# Patient Record
Sex: Female | Born: 1975 | Race: Black or African American | Hispanic: No | Marital: Single | Smoking: Never smoker
Health system: Southern US, Community
[De-identification: ages and names within clinical notes are randomized; demographics above are authoritative.]

## PROBLEM LIST (undated history)

## (undated) DIAGNOSIS — I10 Essential (primary) hypertension: Secondary | ICD-10-CM

## (undated) DIAGNOSIS — E669 Obesity, unspecified: Secondary | ICD-10-CM

---

## 2005-03-22 ENCOUNTER — Emergency Department (HOSPITAL_COMMUNITY): Admission: EM | Admit: 2005-03-22 | Discharge: 2005-03-23 | Payer: Self-pay | Admitting: Emergency Medicine

## 2005-12-12 ENCOUNTER — Ambulatory Visit (HOSPITAL_COMMUNITY): Admission: RE | Admit: 2005-12-12 | Discharge: 2005-12-12 | Payer: Self-pay | Admitting: Obstetrics

## 2006-01-25 ENCOUNTER — Inpatient Hospital Stay (HOSPITAL_COMMUNITY): Admission: AD | Admit: 2006-01-25 | Discharge: 2006-01-30 | Payer: Self-pay | Admitting: Obstetrics

## 2006-06-21 ENCOUNTER — Inpatient Hospital Stay (HOSPITAL_COMMUNITY): Admission: AD | Admit: 2006-06-21 | Discharge: 2006-06-21 | Payer: Self-pay | Admitting: Obstetrics

## 2006-06-28 ENCOUNTER — Inpatient Hospital Stay (HOSPITAL_COMMUNITY): Admission: AD | Admit: 2006-06-28 | Discharge: 2006-06-28 | Payer: Self-pay | Admitting: Obstetrics

## 2006-07-02 ENCOUNTER — Inpatient Hospital Stay (HOSPITAL_COMMUNITY): Admission: AD | Admit: 2006-07-02 | Discharge: 2006-07-05 | Payer: Self-pay | Admitting: Obstetrics

## 2007-05-20 ENCOUNTER — Emergency Department (HOSPITAL_COMMUNITY): Admission: EM | Admit: 2007-05-20 | Discharge: 2007-05-20 | Payer: Self-pay | Admitting: Emergency Medicine

## 2007-06-28 ENCOUNTER — Emergency Department (HOSPITAL_COMMUNITY): Admission: EM | Admit: 2007-06-28 | Discharge: 2007-06-28 | Payer: Self-pay | Admitting: Emergency Medicine

## 2007-11-15 ENCOUNTER — Emergency Department (HOSPITAL_COMMUNITY): Admission: EM | Admit: 2007-11-15 | Discharge: 2007-11-15 | Payer: Self-pay | Admitting: Emergency Medicine

## 2008-01-17 ENCOUNTER — Emergency Department (HOSPITAL_COMMUNITY): Admission: EM | Admit: 2008-01-17 | Discharge: 2008-01-17 | Payer: Self-pay | Admitting: Emergency Medicine

## 2008-03-14 ENCOUNTER — Inpatient Hospital Stay (HOSPITAL_COMMUNITY): Admission: AD | Admit: 2008-03-14 | Discharge: 2008-03-14 | Payer: Self-pay | Admitting: Obstetrics & Gynecology

## 2008-03-14 ENCOUNTER — Ambulatory Visit: Payer: Self-pay | Admitting: Obstetrics and Gynecology

## 2008-03-25 ENCOUNTER — Ambulatory Visit: Payer: Self-pay | Admitting: Family

## 2008-03-25 ENCOUNTER — Inpatient Hospital Stay (HOSPITAL_COMMUNITY): Admission: AD | Admit: 2008-03-25 | Discharge: 2008-03-25 | Payer: Self-pay | Admitting: Obstetrics

## 2008-04-20 ENCOUNTER — Inpatient Hospital Stay (HOSPITAL_COMMUNITY): Admission: AD | Admit: 2008-04-20 | Discharge: 2008-04-20 | Payer: Self-pay | Admitting: Obstetrics

## 2008-06-19 ENCOUNTER — Ambulatory Visit (HOSPITAL_COMMUNITY): Admission: RE | Admit: 2008-06-19 | Discharge: 2008-06-19 | Payer: Self-pay | Admitting: Obstetrics

## 2008-07-12 ENCOUNTER — Inpatient Hospital Stay (HOSPITAL_COMMUNITY): Admission: AD | Admit: 2008-07-12 | Discharge: 2008-07-12 | Payer: Self-pay | Admitting: Obstetrics

## 2008-09-23 ENCOUNTER — Ambulatory Visit (HOSPITAL_COMMUNITY): Admission: RE | Admit: 2008-09-23 | Discharge: 2008-09-23 | Payer: Self-pay | Admitting: Obstetrics

## 2008-10-15 ENCOUNTER — Inpatient Hospital Stay (HOSPITAL_COMMUNITY): Admission: AD | Admit: 2008-10-15 | Discharge: 2008-10-15 | Payer: Self-pay | Admitting: Obstetrics

## 2008-11-07 ENCOUNTER — Inpatient Hospital Stay (HOSPITAL_COMMUNITY): Admission: AD | Admit: 2008-11-07 | Discharge: 2008-11-09 | Payer: Self-pay | Admitting: Obstetrics

## 2008-11-08 ENCOUNTER — Encounter (INDEPENDENT_AMBULATORY_CARE_PROVIDER_SITE_OTHER): Payer: Self-pay | Admitting: Obstetrics

## 2010-01-23 ENCOUNTER — Encounter: Payer: Self-pay | Admitting: Obstetrics

## 2010-04-06 LAB — CBC
MCV: 80 fL (ref 78.0–100.0)
MCV: 80.3 fL (ref 78.0–100.0)
Platelets: 268 10*3/uL (ref 150–400)
RBC: 3.53 MIL/uL — ABNORMAL LOW (ref 3.87–5.11)
RBC: 3.63 MIL/uL — ABNORMAL LOW (ref 3.87–5.11)
RDW: 15.7 % — ABNORMAL HIGH (ref 11.5–15.5)
WBC: 11.4 10*3/uL — ABNORMAL HIGH (ref 4.0–10.5)

## 2010-04-06 LAB — RPR: RPR Ser Ql: NONREACTIVE

## 2010-04-06 LAB — COMPREHENSIVE METABOLIC PANEL
Albumin: 3 g/dL — ABNORMAL LOW (ref 3.5–5.2)
BUN: 1 mg/dL — ABNORMAL LOW (ref 6–23)
CO2: 22 mEq/L (ref 19–32)
Chloride: 107 mEq/L (ref 96–112)
GFR calc Af Amer: >60 mL/min (ref >60)
Glucose, Bld: 90 mg/dL (ref 70–99)
Potassium: 3.8 mEq/L (ref 3.5–5.1)
Total Bilirubin: 0.5 mg/dL (ref 0.3–1.2)

## 2010-04-06 LAB — URIC ACID: Uric Acid, Serum: 5 mg/dL (ref 2.4–7.0)

## 2010-04-10 LAB — URINE MICROSCOPIC-ADD ON

## 2010-04-10 LAB — URINALYSIS, ROUTINE W REFLEX MICROSCOPIC
Bilirubin Urine: NEGATIVE
Glucose, UA: NEGATIVE mg/dL
Hgb urine dipstick: NEGATIVE
Ketones, ur: NEGATIVE mg/dL
Protein, ur: NEGATIVE mg/dL
Specific Gravity, Urine: 1.015 (ref 1.005–1.030)
pH: 6 (ref 5.0–8.0)

## 2010-04-10 LAB — URINE CULTURE

## 2010-04-10 LAB — WET PREP, GENITAL
Clue Cells Wet Prep HPF POC: NONE SEEN
Trich, Wet Prep: NONE SEEN

## 2010-04-10 LAB — GC/CHLAMYDIA PROBE AMP, GENITAL: GC Probe Amp, Genital: NEGATIVE

## 2010-04-13 LAB — URINALYSIS, ROUTINE W REFLEX MICROSCOPIC
Bilirubin Urine: NEGATIVE
Hgb urine dipstick: NEGATIVE
Ketones, ur: 15 mg/dL — AB
Nitrite: NEGATIVE
Protein, ur: NEGATIVE mg/dL
Urobilinogen, UA: 0.2 mg/dL (ref 0.0–1.0)

## 2010-04-13 LAB — WET PREP, GENITAL: Yeast Wet Prep HPF POC: NONE SEEN

## 2010-04-13 LAB — GC/CHLAMYDIA PROBE AMP, GENITAL: Chlamydia, DNA Probe: NEGATIVE

## 2010-04-14 LAB — URINE MICROSCOPIC-ADD ON

## 2010-04-14 LAB — GC/CHLAMYDIA PROBE AMP, GENITAL: GC Probe Amp, Genital: NEGATIVE

## 2010-04-14 LAB — URINALYSIS, ROUTINE W REFLEX MICROSCOPIC
Bilirubin Urine: NEGATIVE
Glucose, UA: NEGATIVE mg/dL
Hgb urine dipstick: NEGATIVE
Ketones, ur: NEGATIVE mg/dL
Nitrite: POSITIVE — AB
Protein, ur: NEGATIVE mg/dL
Specific Gravity, Urine: 1.015 (ref 1.005–1.030)
Specific Gravity, Urine: 1.01 (ref 1.005–1.030)
Urobilinogen, UA: 0.2 mg/dL (ref 0.0–1.0)
pH: 6.5 (ref 5.0–8.0)

## 2010-04-14 LAB — URINE CULTURE

## 2010-04-14 LAB — CBC
HCT: 37.3 % (ref 36.0–46.0)
Hemoglobin: 12.1 g/dL (ref 12.0–15.0)
RBC: 4.39 MIL/uL (ref 3.87–5.11)
RDW: 16.5 % — ABNORMAL HIGH (ref 11.5–15.5)
WBC: 8.4 10*3/uL (ref 4.0–10.5)

## 2010-04-14 LAB — ABO/RH: ABO/RH(D): O POS

## 2010-04-14 LAB — WET PREP, GENITAL
Trich, Wet Prep: NONE SEEN
Yeast Wet Prep HPF POC: NONE SEEN

## 2010-04-18 LAB — POCT I-STAT, CHEM 8
BUN: 4 mg/dL — ABNORMAL LOW (ref 6–23)
Hemoglobin: 13.3 g/dL (ref 12.0–15.0)
Potassium: 3.7 mEq/L (ref 3.5–5.1)
Sodium: 140 mEq/L (ref 135–145)
TCO2: 24 mmol/L (ref 0–100)

## 2010-04-18 LAB — URINALYSIS, ROUTINE W REFLEX MICROSCOPIC
Bilirubin Urine: NEGATIVE
Hgb urine dipstick: NEGATIVE
Ketones, ur: NEGATIVE mg/dL
Specific Gravity, Urine: 1.02 (ref 1.005–1.030)
Urobilinogen, UA: 0.2 mg/dL (ref 0.0–1.0)

## 2010-04-18 LAB — URINE MICROSCOPIC-ADD ON

## 2010-04-18 LAB — PREGNANCY, URINE: Preg Test, Ur: NEGATIVE

## 2010-05-20 NOTE — Discharge Summary (Signed)
NAMEIMBERLY, TROXLER                 ACCOUNT NO.:  [id number]   MEDICAL RECORD NO.:  [id number]          PATIENT TYPE:  INP   LOCATION:  9320                          FACILITY:  [facility]   PHYSICIAN:  Kathreen Cosier, M.D.DATE OF BIRTH:  1975-09-28   DATE OF ADMISSION:  01/25/2006  DATE OF DISCHARGE:  01/30/2006                               DISCHARGE SUMMARY   The patient is a 35 year old gravida 6, para 4-0-1-3 who was admitted  with back pain on the right radiating to her flank. She is [redacted] weeks  pregnant. She had a positive urine, 21 to 50 white, many bacteria. She  was admitted for pyelonephritis and started on Rocephin. The patient had  temperature elevation once daily, and by January 29, she was afebrile  for greater than 24 hours. She was discharged home on ampicillin 500  p.o. q.6h. to see me in 2 weeks.   DISCHARGE DIAGNOSES:  1. Status post intrauterine pregnancy, 17 weeks.  2. Pyelonephritis.           ______________________________  Kathreen Cosier, M.D.     BAM/MEDQ  D:  02/14/2006  T:  02/14/2006  Job:  [id number]

## 2010-05-20 NOTE — Discharge Summary (Signed)
Molly Green, Molly Green                 ACCOUNT NO.:  [id number]   MEDICAL RECORD NO.:  [id number]          PATIENT TYPE:  INP   LOCATION:  9123                          FACILITY:  [facility]   PHYSICIAN:  Kathreen Cosier, M.D.DATE OF BIRTH:  [birth year]   DATE OF ADMISSION:  07/02/2006  DATE OF DISCHARGE:  07/05/2006                               DISCHARGE SUMMARY   The patient is a 35 year old gravida 6, para 4-0-1-2; [facility] July 07, 2006.  She was admitted with premature contractions and she had decreased  variability.  She had a positive GBS and had a C-section in 1996 and  then had three subsequent vaginal deliveries.  On admission her cervix  was 3 cm, 80%, vertex -2 to -3.  Membranes ruptured artificially, no  fluid obtained, and IUPC was inserted and started on low-dose Pitocin.  She had normal vaginal delivery from the LOA position of a female,  Apgars 8 and 9.  There was a nuchal cord x1 reduced prior to delivery.  Placenta was spontaneous and intact.  She was kept on magnesium sulfate  and on postpartum day #1 blood pressure was 12/84 and magnesium was  discontinued.  She was on labetalol 200 mg p.o. b.i.d., and by the  second postpartum day blood pressure was 124/70.  She was discharged on  labetalol 200 p.o. b.i.d. and Tylenol No. 3 for pain.  On admission,  hemoglobin 9.6, postdelivery 9.6.  White count 11.8 and 14.3.  Sodium  139, potassium 3.6, chloride 112, creatinine 0.52.  She was discharged  home to see me in 1 week for blood pressure check.   DISCHARGE DIAGNOSES:  1. Status post normal vaginal delivery at term.  2. Elevated blood pressures.           ______________________________  Kathreen Cosier, M.D.     BAM/MEDQ  D:  08/08/2006  T:  08/08/2006  Job:  [id number]

## 2010-09-28 LAB — DIFFERENTIAL
Lymphs Abs: 3.1
Monocytes Relative: 5
Neutro Abs: 6.6
Neutrophils Relative %: 63

## 2010-09-28 LAB — URINALYSIS, ROUTINE W REFLEX MICROSCOPIC
Glucose, UA: NEGATIVE
Specific Gravity, Urine: 1.016
pH: 6.5

## 2010-09-28 LAB — POCT I-STAT, CHEM 8
Calcium, Ion: 1.19
Chloride: 103
Glucose, Bld: 88
HCT: 40

## 2010-09-28 LAB — CBC
RBC: 4.42
WBC: 10.5

## 2010-09-28 LAB — URINE MICROSCOPIC-ADD ON

## 2010-09-28 LAB — POCT PREGNANCY, URINE: Operator id: 151321

## 2010-09-29 LAB — URINE MICROSCOPIC-ADD ON

## 2010-09-29 LAB — URINALYSIS, ROUTINE W REFLEX MICROSCOPIC
Bilirubin Urine: NEGATIVE
Ketones, ur: NEGATIVE
Nitrite: POSITIVE — AB
pH: 6.5

## 2010-09-29 LAB — URINE CULTURE: Colony Count: >=100000

## 2010-09-29 LAB — POCT PREGNANCY, URINE
Operator id: 29502
Preg Test, Ur: NEGATIVE

## 2010-10-04 LAB — URINE CULTURE

## 2010-10-04 LAB — URINALYSIS, ROUTINE W REFLEX MICROSCOPIC
Bilirubin Urine: NEGATIVE
Glucose, UA: NEGATIVE
Hgb urine dipstick: NEGATIVE
Ketones, ur: NEGATIVE
Protein, ur: NEGATIVE
pH: 6.5

## 2010-10-04 LAB — URINE MICROSCOPIC-ADD ON

## 2010-10-18 LAB — COMPREHENSIVE METABOLIC PANEL
ALT: 9
Alkaline Phosphatase: 121 — ABNORMAL HIGH
BUN: 1 — ABNORMAL LOW
CO2: 26
GFR calc non Af Amer: >60
Glucose, Bld: 70
Potassium: 3.2 — ABNORMAL LOW
Sodium: 140
Total Protein: 5.2 — ABNORMAL LOW

## 2010-10-18 LAB — LACTATE DEHYDROGENASE: LDH: 179

## 2010-10-18 LAB — CBC
HCT: 28.9 — ABNORMAL LOW
Hemoglobin: 9.6 — ABNORMAL LOW
MCHC: 33
RBC: 3.52 — ABNORMAL LOW
RDW: 14.6 — ABNORMAL HIGH

## 2010-10-18 LAB — MAGNESIUM: Magnesium: 4.5 — ABNORMAL HIGH

## 2010-10-19 LAB — RPR: RPR Ser Ql: NONREACTIVE

## 2010-10-19 LAB — COMPREHENSIVE METABOLIC PANEL
AST: 18
Albumin: 2.9 — ABNORMAL LOW
BUN: 3 — ABNORMAL LOW
Calcium: 8.6
Chloride: 112
Creatinine, Ser: 0.52
GFR calc Af Amer: >60
Total Bilirubin: 0.7
Total Protein: 5.6 — ABNORMAL LOW

## 2010-10-19 LAB — CBC
MCHC: 32.9
MCV: 82.5
Platelets: 260
RBC: 3.55 — ABNORMAL LOW
RDW: 14.2 — ABNORMAL HIGH

## 2010-10-19 LAB — URIC ACID: Uric Acid, Serum: 5.2

## 2011-11-17 ENCOUNTER — Emergency Department (HOSPITAL_COMMUNITY)
Admission: EM | Admit: 2011-11-17 | Discharge: 2011-11-17 | Disposition: A | Discharge status: Home / Self Care | Payer: Self-pay | Attending: Emergency Medicine | Admitting: Emergency Medicine

## 2011-11-17 DIAGNOSIS — S61219A Laceration without foreign body of unspecified finger without damage to nail, initial encounter: Secondary | ICD-10-CM

## 2011-11-17 DIAGNOSIS — W260XXA Contact with knife, initial encounter: Secondary | ICD-10-CM | POA: Insufficient documentation

## 2011-11-17 DIAGNOSIS — Y929 Unspecified place or not applicable: Secondary | ICD-10-CM | POA: Insufficient documentation

## 2011-11-17 DIAGNOSIS — Y93H2 Activity, gardening and landscaping: Secondary | ICD-10-CM | POA: Insufficient documentation

## 2011-11-17 DIAGNOSIS — S61209A Unspecified open wound of unspecified finger without damage to nail, initial encounter: Secondary | ICD-10-CM | POA: Insufficient documentation

## 2011-11-17 NOTE — ED Notes (Signed)
Pt accidentally jabbed L index finger with a knife while cutting some roses. No bleeding at present.

## 2011-11-17 NOTE — ED Provider Notes (Signed)
History     CSN: [id number]  Arrival date & time 11/17/11  1707   First MD Initiated Contact with Patient 11/17/11 1721      Chief Complaint  Patient presents with  . Finger Laceration     (Consider location/radiation/quality/duration/timing/severity/associated sxs/prior treatment) Patient is a 36 y.o. female presenting with skin laceration. The history is provided by the patient.  Laceration  The incident occurred 1 to 2 hours ago. The laceration is located on the left hand. The laceration is 1 cm in size. The laceration mechanism was a a metal edge. She reports no foreign bodies present. Her tetanus status is UTD.    No past medical history on file.  No past surgical history on file.  No family history on file.  History  Substance Use Topics  . Smoking status: Not on file  . Smokeless tobacco: Not on file  . Alcohol Use: Not on file    OB History    No data available      Review of Systems  Constitutional: Negative.   Musculoskeletal: Negative.   Skin:       C/O laceration.  Neurological: Negative.     Allergies  Review of patient's allergies indicates no known allergies.  Home Medications  No current outpatient prescriptions on file.  BP 170/115  Pulse 79  Temp 98.3 F (36.8 C) (Oral)  Resp 17  SpO2 100%  Physical Exam  Constitutional: She appears well-developed and well-nourished.  Pulmonary/Chest: Effort normal.  Skin: Skin is warm and dry.       1 cm laceration perpendicular to nail edge left index finger.. Closed, no active bleeding. No swelling to finger. FROM.    ED Course  Procedures (including critical care time)  Labs Reviewed - No data to display No results found.   No diagnosis found.  1. Laceration, nonsuturable, left index finger  MDM  Uncomplicated finger laceration. Cleaned and steristrip applied.        Rodena Medin, PA-C 11/17/11 [phone]

## 2011-11-18 NOTE — ED Provider Notes (Signed)
Medical screening examination/treatment/procedure(s) were performed by non-physician practitioner and as supervising physician I was immediately available for consultation/collaboration.  Gerhard Munch, MD 11/18/11 0000

## 2014-08-09 ENCOUNTER — Emergency Department (HOSPITAL_COMMUNITY)
Admission: EM | Admit: 2014-08-09 | Discharge: 2014-08-09 | Disposition: A | Discharge status: Home / Self Care | Payer: Medicaid Other | Attending: Emergency Medicine | Admitting: Emergency Medicine

## 2014-08-09 ENCOUNTER — Encounter (HOSPITAL_COMMUNITY): Payer: Self-pay | Admitting: *Deleted

## 2014-08-09 DIAGNOSIS — W19XXXA Unspecified fall, initial encounter: Secondary | ICD-10-CM

## 2014-08-09 DIAGNOSIS — Y998 Other external cause status: Secondary | ICD-10-CM | POA: Diagnosis not present

## 2014-08-09 DIAGNOSIS — Y9289 Other specified places as the place of occurrence of the external cause: Secondary | ICD-10-CM | POA: Diagnosis not present

## 2014-08-09 DIAGNOSIS — I10 Essential (primary) hypertension: Secondary | ICD-10-CM | POA: Insufficient documentation

## 2014-08-09 DIAGNOSIS — S79911A Unspecified injury of right hip, initial encounter: Secondary | ICD-10-CM | POA: Diagnosis present

## 2014-08-09 DIAGNOSIS — Y9389 Activity, other specified: Secondary | ICD-10-CM | POA: Diagnosis not present

## 2014-08-09 DIAGNOSIS — S3992XA Unspecified injury of lower back, initial encounter: Secondary | ICD-10-CM | POA: Insufficient documentation

## 2014-08-09 DIAGNOSIS — E669 Obesity, unspecified: Secondary | ICD-10-CM | POA: Insufficient documentation

## 2014-08-09 DIAGNOSIS — M79604 Pain in right leg: Secondary | ICD-10-CM

## 2014-08-09 DIAGNOSIS — W010XXA Fall on same level from slipping, tripping and stumbling without subsequent striking against object, initial encounter: Secondary | ICD-10-CM | POA: Diagnosis not present

## 2014-08-09 HISTORY — DX: Essential (primary) hypertension: I10

## 2014-08-09 HISTORY — DX: Obesity, unspecified: E66.9

## 2014-08-09 MED ORDER — METHOCARBAMOL 500 MG PO TABS: 500.0000 mg | ORAL_TABLET | Freq: Two times a day (BID) | ORAL | Status: DC

## 2014-08-09 MED ORDER — NAPROXEN 500 MG PO TABS: 500.0000 mg | ORAL_TABLET | Freq: Two times a day (BID) | ORAL | Status: DC

## 2014-08-09 MED ORDER — METHOCARBAMOL 500 MG PO TABS
500.0000 mg | ORAL_TABLET | Freq: Once | ORAL | Status: AC
Start: 2014-08-09 — End: 2014-08-09
  Administered 2014-08-09: 500 mg via ORAL
  Filled 2014-08-09: qty 1

## 2014-08-09 MED ORDER — OXYCODONE-ACETAMINOPHEN 5-325 MG PO TABS
1.0000 | ORAL_TABLET | Freq: Once | ORAL | Status: AC
  Administered 2014-08-09: 1 via ORAL

## 2014-08-09 MED ORDER — OXYCODONE-ACETAMINOPHEN 5-325 MG PO TABS
ORAL_TABLET | ORAL | Status: AC
  Filled 2014-08-09: qty 1

## 2014-08-09 NOTE — ED Provider Notes (Signed)
CSN: [id number]     Arrival date & time 08/09/14  1710 History  This chart was scribed for non-physician practitioner, Catha Gosselin, PA-C, working with Pricilla Loveless, MD, by Ronney Lion, ED Scribe. This patient was seen in room TR06C/TR06C and the patient's care was started at 6:59 PM.    Chief Complaint  Patient presents with  . Fall   The history is provided by the patient and the spouse. No language interpreter was used.   HPI Comments: Molly Green is a 39 y.o. female who presents to the Emergency Department complaining of right leg pain S/P falling after slipping on a puddle at [employer] about 7 hours ago. Her husband states she landed on the ground and landed in a way resembling a split. He and a sales person lifted her up immediately after she fell. She denies head injury or LOC. Patient is able to ambulate without difficulty. She denies numbness.   Past Medical History  Diagnosis Date  . Obesity   . Hypertension    History reviewed. No pertinent past surgical history. History reviewed. No pertinent family history. History  Substance Use Topics  . Smoking status: Not on file  . Smokeless tobacco: Not on file  . Alcohol Use: No   OB History    No data available     Review of Systems  Musculoskeletal: Positive for back pain and arthralgias (right hip pain).  Neurological: Negative for numbness.   Allergies  Review of patient's allergies indicates no known allergies.  Home Medications   Prior to Admission medications   Medication Sig Start Date End Date Taking? Authorizing Provider  methocarbamol (ROBAXIN) 500 MG tablet Take 1 tablet (500 mg total) by mouth 2 (two) times daily. 08/09/14   Jarad Barth Patel-Mills, PA-C  naproxen (NAPROSYN) 500 MG tablet Take 1 tablet (500 mg total) by mouth 2 (two) times daily. 08/09/14   Laure Leone Patel-Mills, PA-C   BP 159/97 mmHg  Pulse 72  Temp(Src) 99 F (37.2 C) (Oral)  Resp 16  SpO2 100%  LMP 08/06/2014 Physical Exam  Constitutional:  She is oriented to person, place, and time. She appears well-developed and well-nourished. No distress.  HENT:  Head: Normocephalic and atraumatic.  Eyes: Conjunctivae and EOM are normal.  Neck: Neck supple. No tracheal deviation present.  Cardiovascular: Normal rate, regular rhythm and normal heart sounds.   Pulses:      Dorsalis pedis pulses are 2+ on the right side, and 2+ on the left side.       Posterior tibial pulses are 2+ on the right side, and 2+ on the left side.  Pulmonary/Chest: Effort normal. No respiratory distress.  Musculoskeletal: Normal range of motion.  Able to flex and extend right knee. DP and PT pulses 2+. No saddle anesthesia. No midline lumbar tenderness. Hips are stable.   Neurological: She is alert and oriented to person, place, and time. Gait normal.  Ambulatory with steady gait. No leg shortening.  Skin: Skin is warm and dry.  Psychiatric: She has a normal mood and affect. Her behavior is normal.  Nursing note and vitals reviewed.   ED Course  Procedures (including critical care time)  DIAGNOSTIC STUDIES: Oxygen Saturation is 100% on RA, normal by my interpretation.    COORDINATION OF CARE: 7:00 PM - Doubt fracture. Suspect muscle injury. Discussed treatment plan with pt at bedside which includes Rx muscle relaxants and anti-inflammatories. As pt's husband is driving, will administer one dose of muscle relaxants here. Pt verbalized understanding  and agreed to plan.  MDM   Final diagnoses:  Fall, initial encounter  Right leg pain  Patient has no signs for cauda equina syndrome. I do not suspect hip or pelvis fracture. I personally performed the services described in this documentation, which was scribed in my presence. The recorded information has been reviewed and is accurate.     Catha Gosselin, PA-C 08/09/14 2314  Pricilla Loveless, MD 08/10/14 [phone]

## 2014-08-09 NOTE — Discharge Instructions (Signed)

## 2014-08-09 NOTE — ED Notes (Signed)
Ice pack given

## 2014-08-09 NOTE — ED Notes (Signed)
Pt reports falling at [employer] and now has lower back and right hip pain.

## 2015-11-03 ENCOUNTER — Encounter (HOSPITAL_COMMUNITY): Payer: Self-pay | Admitting: *Deleted

## 2015-11-03 ENCOUNTER — Emergency Department (HOSPITAL_COMMUNITY)
Admission: EM | Admit: 2015-11-03 | Discharge: 2015-11-03 | Disposition: A | Discharge status: Home / Self Care | Payer: Worker's Compensation | Attending: Emergency Medicine | Admitting: Emergency Medicine

## 2015-11-03 ENCOUNTER — Emergency Department (HOSPITAL_COMMUNITY): Payer: Worker's Compensation

## 2015-11-03 DIAGNOSIS — I10 Essential (primary) hypertension: Secondary | ICD-10-CM | POA: Insufficient documentation

## 2015-11-03 DIAGNOSIS — X101XXA Contact with hot food, initial encounter: Secondary | ICD-10-CM | POA: Diagnosis not present

## 2015-11-03 DIAGNOSIS — T25121A Burn of first degree of right foot, initial encounter: Secondary | ICD-10-CM | POA: Diagnosis not present

## 2015-11-03 DIAGNOSIS — T300 Burn of unspecified body region, unspecified degree: Secondary | ICD-10-CM

## 2015-11-03 DIAGNOSIS — Y929 Unspecified place or not applicable: Secondary | ICD-10-CM | POA: Insufficient documentation

## 2015-11-03 DIAGNOSIS — Y93G3 Activity, cooking and baking: Secondary | ICD-10-CM | POA: Insufficient documentation

## 2015-11-03 DIAGNOSIS — Y990 Civilian activity done for income or pay: Secondary | ICD-10-CM | POA: Diagnosis not present

## 2015-11-03 DIAGNOSIS — S9031XA Contusion of right foot, initial encounter: Secondary | ICD-10-CM

## 2015-11-03 DIAGNOSIS — S99921A Unspecified injury of right foot, initial encounter: Secondary | ICD-10-CM | POA: Diagnosis present

## 2015-11-03 MED ORDER — IBUPROFEN 600 MG PO TABS: 600.0000 mg | ORAL_TABLET | Freq: Four times a day (QID) | ORAL | 0 refills | Status: DC | PRN

## 2015-11-03 MED ORDER — IBUPROFEN 800 MG PO TABS
800.0000 mg | ORAL_TABLET | Freq: Once | ORAL | Status: AC
  Administered 2015-11-03: 800 mg via ORAL
  Filled 2015-11-03: qty 1

## 2015-11-03 MED ORDER — SILVER SULFADIAZINE 1 % EX CREA: 1.0000 "application " | TOPICAL_CREAM | Freq: Every day | CUTANEOUS | 0 refills | Status: DC

## 2015-11-03 MED ORDER — TRAMADOL HCL 50 MG PO TABS: 50.0000 mg | ORAL_TABLET | Freq: Four times a day (QID) | ORAL | 0 refills | Status: DC | PRN

## 2015-11-03 NOTE — ED Provider Notes (Signed)
[facility]-EMERGENCY DEPT Provider Note   CSN: [id number] Arrival date & time: 11/03/15  0840     History   Chief Complaint Chief Complaint  Patient presents with  . Burn  . Cough    HPI Molly Green is a 40 y.o. female.  Patient is a 40 year old female with past medical history of hypertension who presents the ED with complaint of burn. 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 She reports having a small burn to the top of her right foot. Endorses associated pain to her foot which she states is worse when bearing weight and reports having a blister that has formed on the top of her foot. She reports using over-the-counter burn cream and placing ice on her foot with very mild relief. Denies taking any medications. Denies fever, swelling, drainage, numbness, tingling, weakness.  She also reports she has had a nonproductive cough for the past month. She notes she initially started with cold-like symptoms a few weeks ago including nasal congestion, rhinorrhea and sore throat but notes the symptoms have since resolved. She states however she has continued to have a nonproductive cough over the past week. Denies taking any medications for her symptoms. Denies fever, chills, headache, nasal congestion, sore throat, hemoptysis, wheezing, shortness of breath, chest pain, vomiting, abdominal pain. Denies any known sick contacts.      Past Medical History:  Diagnosis Date  . Hypertension   . Obesity     There are no active problems to display for this patient.   History reviewed. No pertinent surgical history.  OB History    No data available       Home Medications    Prior to Admission medications   Medication Sig Start Date End Date Taking?  Authorizing Provider  ibuprofen (ADVIL,MOTRIN) 600 MG tablet Take 1 tablet (600 mg total) by mouth every 6 (six) hours as needed. 11/03/15   Barrett Henle, PA-C  methocarbamol (ROBAXIN) 500 MG tablet Take 1 tablet (500 mg total) by mouth 2 (two) times daily. Patient not taking: Reported on 11/03/2015 08/09/14   Catha Gosselin, PA-C  naproxen (NAPROSYN) 500 MG tablet Take 1 tablet (500 mg total) by mouth 2 (two) times daily. Patient not taking: Reported on 11/03/2015 08/09/14   Catha Gosselin, PA-C  silver sulfADIAZINE (SILVADENE) 1 % cream Apply 1 application topically daily. 11/03/15   Barrett Henle, PA-C  traMADol (ULTRAM) 50 MG tablet Take 1 tablet (50 mg total) by mouth every 6 (six) hours as needed. 11/03/15   Barrett Henle, PA-C    Family History No family history on file.  Social History Social History  Substance Use Topics  . Smoking status: Never Smoker  . Smokeless tobacco: Never Used  . Alcohol use No     Allergies   Review of patient's allergies indicates no known allergies.   Review of Systems Review of Systems  Respiratory: Positive for cough.   Musculoskeletal: Positive for arthralgias (right foot).  Skin: Positive for wound (burn).  All other systems reviewed and are negative.    Physical Exam Updated Vital Signs BP 162/97   Pulse 74   Temp 98.1 F (36.7 C) (Oral)   Resp 16   Ht  5\' 4"  (1.626 m)   Wt 97.5 kg   LMP 10/20/2015 (Approximate)   SpO2 100%   BMI 36.90 kg/m   Physical Exam  Constitutional: She is oriented to person, place, and time. She appears well-developed and well-nourished.  HENT:  Head: Normocephalic and atraumatic.  Nose: Nose normal.  Mouth/Throat: Uvula is midline, oropharynx is clear and moist and mucous membranes are normal. No oropharyngeal exudate, posterior oropharyngeal edema, posterior oropharyngeal erythema or tonsillar abscesses. No tonsillar exudate.  Eyes: Conjunctivae and EOM are normal.  Right eye exhibits no discharge. Left eye exhibits no discharge. No scleral icterus.  Neck: Normal range of motion. Neck supple.  Cardiovascular: Normal rate, regular rhythm, normal heart sounds and intact distal pulses.   Pulmonary/Chest: Effort normal and breath sounds normal. No respiratory distress. She has no wheezes. She has no rales. She exhibits no tenderness.  Abdominal: Soft. She exhibits no distension.  Musculoskeletal: Normal range of motion. She exhibits tenderness. She exhibits no edema or deformity.       Right ankle: She exhibits normal range of motion, no swelling, no ecchymosis, no deformity, no laceration and normal pulse. No tenderness. Achilles tendon normal.       Right foot: There is tenderness. There is normal range of motion, no swelling, normal capillary refill, no crepitus, no deformity and no laceration.       Feet:  Lymphadenopathy:    She has no cervical adenopathy.  Neurological: She is alert and oriented to person, place, and time.  Skin: Skin is warm and dry.  Erythema noted to dorsal aspect of right midfoot with small blister present, TTP. No drainage.   Nursing note and vitals reviewed.    ED Treatments / Results  Labs (all labs ordered are listed, but only abnormal results are displayed) Labs Reviewed - No data to display  EKG  EKG Interpretation None       Radiology Ct Foot Right Wo Contrast  Result Date: 11/03/2015 CLINICAL DATA:  Right foot pain.  Can't bear weight. EXAM: CT OF THE RIGHT FOOT WITHOUT CONTRAST TECHNIQUE: Multidetector CT imaging of the right foot was performed according to the standard protocol. Multiplanar CT image reconstructions were also generated. COMPARISON:  None. FINDINGS: Bones/Joint/Cartilage No acute fracture or dislocation. Moderate osteoarthritis of the first MTP joint. Prior hallux valgus repair. Mild osteoarthritis of the first tarsal metatarsal joint. 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  Enthesopathic changes of the Achilles tendon insertion. Normal subtalar joints. Normal ankle mortise. Mild osteoarthritis of the talonavicular joint. Degenerative changes of the medial hallux sesamoid-metatarsal articulation. No aggressive lytic or sclerotic osseous lesion. Ligaments Suboptimally assessed by CT. Muscles and Tendons Muscles are normal. Flexor, extensor, peroneal and Achilles tendons are grossly intact. Soft tissues No fluid collection or hematoma. IMPRESSION: 1.  No acute osseous injury of the right foot. 2. Prior hallux valgus repair. Moderate osteoarthritis of the first MTP joint. Mild osteoarthritis of the first tarsal metatarsal joint. 3. Osseous coalition of the middle-medial cuneiform. Electronically Signed   By: Elige Ko   On: 11/03/2015 11:05   Dg Foot Complete Right  Result Date: 11/03/2015 CLINICAL DATA:  Patient dropped a can of hot belted cheese on the right foot at work last night with blistering over the foot and pain over the dorsum of the foot. EXAM: RIGHT FOOT COMPLETE - 3+ VIEW COMPARISON:  None in PACs FINDINGS: The bones are subjectively adequately mineralized. The phalanges are intact. There are degenerative changes of the first MTP  joint. There is a mild hallux valgus contour of the first ray which may have been previously surgically treated. The second through fifth MTP joints are unremarkable. The IP joints exhibit no acute abnormalities. There is a double density that projects in the region of the first and second cuneiforms which may be degenerative change or could reflect a fracture. The third through fifth metatarsal bases appear normal. There is an Achilles region calcaneal spur. IMPRESSION: 1. Abnormal appearance of the first and second cuneiforms which may reflect acute fracture or dislocation. Degenerative or old post traumatic changes could produce similar findings. 2. Moderate degenerative change of the first MTP joint. Possible previous hallux valgus repair  versus old trauma involving the distal aspect of the first metatarsal. 3. If the object dropped upon the foot was heavy and a fracture felt clinically possible, CT scanning of the right foot with attention to the first tarsometatarsal joint and the first metatarsophalangeal joint would be useful. Electronically Signed   By: David  SwazilandJordan M.D.   On: 11/03/2015 09:37    Procedures Procedures (including critical care time)  Medications Ordered in ED Medications  ibuprofen (ADVIL,MOTRIN) tablet 800 mg (800 mg Oral Given 11/03/15 0930)     Initial Impression / Assessment and Plan / ED Course  I have reviewed the triage vital signs and the nursing notes.  Pertinent labs & imaging results that were available during my care of the patient were reviewed by me and considered in my medical decision making (see chart for details).  Clinical Course    Patient presents with burn to her right foot that occurred yesterday while at work. Reports dropping a can of hot cheese on her foot, endorses associated pain and reports having a blister appear today. Denies fever, drainage. History of bunion surgery to right foot. VSS. Exam revealed small superficial partial-thickness burn to dorsum of right foot with blister present, no drainage, no surrounding swelling, erythema or warmth. Tenderness over dorsum of right foot. Right lower extremity neurovascular intact. Right foot x-ray revealed abnormal appearance of first and second cuneiforms which may reflect acute fracture or dislocation, recommend CT scan for further evaluation. CT revealed no acute injury. Dressing and esterase and ointment applied to burn in the ED. Plan to discharge patient home with wound care for burn and symptomatically treatment. Discussed return precautions.  Patient also reports having nonproductive cough for the past month, reports initially having cold-like symptoms a few weeks ago which have since resolved. Denies fever. Exam unremarkable,  lungs clear to auscultation bilaterally. Do not feel that CXR is warranted at this time, suspect possible postnasal drip cough. Pt discharge patient with symptomatic treatment and advised to take Robitussin as needed. Advised to follow up with PCP for further evaluation. Discussed return precautions.  Final Clinical Impressions(s) / ED Diagnoses   Final diagnoses:  Burn  Contusion of right foot, initial encounter    New Prescriptions Discharge Medication List as of 11/03/2015 11:36 AM    START taking these medications   Details  ibuprofen (ADVIL,MOTRIN) 600 MG tablet Take 1 tablet (600 mg total) by mouth every 6 (six) hours as needed., Starting Wed 11/03/2015, Print    silver sulfADIAZINE (SILVADENE) 1 % cream Apply 1 application topically daily., Starting Wed 11/03/2015, Print    traMADol (ULTRAM) 50 MG tablet Take 1 tablet (50 mg total) by mouth every 6 (six) hours as needed., Starting Wed 11/03/2015, Print         Satira Sarkicole Elizabeth StrausstownNadeau, [place] JerseyPA-C 11/03/15 1524  Canary Brimhristopher J Tegeler, MD 11/03/15 [phone]

## 2015-11-03 NOTE — ED Triage Notes (Addendum)
Patient presents from home with c/o intermittently productive cough x3-4 weeks following an URI several weeks ago. Patient denies smoking or hx of allergies or respiratory illness.  Patient denies N/V/D and fever.  Patient's lung sounds CTAB with no increased WOB.  Heart sounds normal, S1/S2, +2 radial pulses bilaterally.  Patient also c/o burn to top of right foot after some hot melted cheese sauce splashed on her foot at work last night. Skin is intact, but some blistering noted.  No deformity or swelling noted.  Patient c/o pain in that foot and states in addition to the hot cheese, the pan the cheese was in also fell on her foot.  +1 dorsalis pedis pulse palpated.  Lower extremity strength equal bilaterally, light touch sensation intact.

## 2015-11-03 NOTE — Progress Notes (Signed)
[facility] ([facility] listed per [employer] [place] access response hx in EPIC registration section  Cm spoke with pt who has her medicaid card for 2014-2016 but not 2017 Pt confirms she has not changed addresses and has stop going to [facility] and go to another md near [facility] Can not recall correct name  Cm discussed with her the what was listed in EPIC as her pcp [facility] and encouraged her to contact DSS for any corrections related to her medicaid MD  ED RN came in to d/c pt

## 2015-11-03 NOTE — Discharge Instructions (Signed)
Keep wound clean using antibacterial soap and water, gently pat dry. You may apply a small amount of bacitracin ointment to wound daily. Take your medications as prescribed as needed for pain relief. I also recommend applying ice to affected area for 15 minutes 3-4 times daily for additional relief. Please follow up with a primary care provider from the Resource Guide provided below in one week if your symptoms have not improved. Please return to the Emergency Department if symptoms worsen or new onset of fever, redness, swelling, warmth, drainage, numbness, tingling, weakness.

## 2016-02-09 ENCOUNTER — Other Ambulatory Visit (HOSPITAL_COMMUNITY)
Admission: RE | Admit: 2016-02-09 | Discharge: 2016-02-09 | Disposition: A | Discharge status: Home / Self Care | Payer: Medicaid Other | Source: Ambulatory Visit | Attending: Obstetrics & Gynecology | Admitting: Obstetrics & Gynecology

## 2016-02-09 ENCOUNTER — Encounter: Payer: Self-pay | Admitting: Obstetrics & Gynecology

## 2016-02-09 ENCOUNTER — Ambulatory Visit (INDEPENDENT_AMBULATORY_CARE_PROVIDER_SITE_OTHER): Payer: Medicaid Other | Admitting: Obstetrics & Gynecology

## 2016-02-09 VITALS — BP 167/104 | HR 75 | Ht 64.0 in | Wt 202.0 lb

## 2016-02-09 DIAGNOSIS — Z1151 Encounter for screening for human papillomavirus (HPV): Secondary | ICD-10-CM | POA: Insufficient documentation

## 2016-02-09 DIAGNOSIS — Z0000 Encounter for general adult medical examination without abnormal findings: Secondary | ICD-10-CM | POA: Diagnosis not present

## 2016-02-09 DIAGNOSIS — Z01419 Encounter for gynecological examination (general) (routine) without abnormal findings: Secondary | ICD-10-CM

## 2016-02-09 DIAGNOSIS — I10 Essential (primary) hypertension: Secondary | ICD-10-CM

## 2016-02-09 DIAGNOSIS — Z113 Encounter for screening for infections with a predominantly sexual mode of transmission: Secondary | ICD-10-CM | POA: Insufficient documentation

## 2016-02-09 DIAGNOSIS — N938 Other specified abnormal uterine and vaginal bleeding: Secondary | ICD-10-CM

## 2016-02-09 DIAGNOSIS — Z1231 Encounter for screening mammogram for malignant neoplasm of breast: Secondary | ICD-10-CM

## 2016-02-09 LAB — COMPREHENSIVE METABOLIC PANEL WITH GFR
ALT: 6 U/L (ref 6–29)
AST: 13 U/L (ref 10–30)
Albumin: 4.4 g/dL (ref 3.6–5.1)
Alkaline Phosphatase: 71 U/L (ref 33–115)
BUN: 8 mg/dL (ref 7–25)
CO2: 25 mmol/L (ref 20–31)
Calcium: 9.1 mg/dL (ref 8.6–10.2)
Chloride: 106 mmol/L (ref 98–110)
Creat: 0.81 mg/dL (ref 0.50–1.10)
Glucose, Bld: 90 mg/dL (ref 65–99)
Potassium: 3.9 mmol/L (ref 3.5–5.3)
Sodium: 143 mmol/L (ref 135–146)
Total Bilirubin: 0.6 mg/dL (ref 0.2–1.2)
Total Protein: 6.9 g/dL (ref 6.1–8.1)

## 2016-02-09 LAB — CBC
HCT: 34.1 % — ABNORMAL LOW (ref 35.0–45.0)
HEMOGLOBIN: 10.1 g/dL — AB (ref 11.7–15.5)
MCH: 23 pg — ABNORMAL LOW (ref 27.0–33.0)
MCHC: 29.6 g/dL — AB (ref 32.0–36.0)
MCV: 77.5 fL — ABNORMAL LOW (ref 80.0–100.0)
MPV: 9.1 fL (ref 7.5–12.5)
Platelets: 409 10*3/uL — ABNORMAL HIGH (ref 140–400)
RBC: 4.4 MIL/uL (ref 3.80–5.10)
RDW: 17.2 % — ABNORMAL HIGH (ref 11.0–15.0)
WBC: 6.5 10*3/uL (ref 3.8–10.8)

## 2016-02-09 LAB — TSH: TSH: 0.97 mIU/L

## 2016-02-09 MED ORDER — HYDROCHLOROTHIAZIDE 25 MG PO TABS: 25.0000 mg | ORAL_TABLET | Freq: Every day | ORAL | 3 refills | Status: DC

## 2016-02-09 MED ORDER — NORETHIN ACE-ETH ESTRAD-FE 1.5-30 MG-MCG PO TABS: 1.0000 | ORAL_TABLET | Freq: Every day | ORAL | 11 refills | Status: DC

## 2016-02-09 NOTE — Patient Instructions (Addendum)
For Primary Care: [facility] [address], [place] [phone]     Advance Directive Advance directives are the legal documents that allow you to make choices about your health care and medical treatment if you cannot speak for yourself. Advance directives are a way for you to communicate your wishes to family, friends, and health care providers. The specified people can then convey your decisions about end-of-life care to avoid confusion if you should become unable to communicate. Ideally, the process of discussing and writing advance directives should happen over time rather than making decisions all at once. Advance directives can be modified as your situation changes, and you can change your mind at any time, even after you have signed the advance directives. Each state has its own laws regarding advance directives. You may want to check with your health care provider, attorney, or state representative about the law in your state. Below are some examples of advance directives. HEALTH CARE PROXY AND DURABLE POWER OF ATTORNEY FOR HEALTH CARE A health care proxy is a person (agent) appointed to make medical decisions for you if you cannot. Generally, people choose someone they know well and trust to represent their preferences when they can no longer do so. You should be sure to ask this person for agreement to act as your agent. An agent may have to exercise judgment in the event of a medical decision for which your wishes are not known. A durable power of attorney for health care is a legal document that names your health care proxy. Depending on the laws in your state, after the document is written, it may also need to be:  Signed.  Notarized.  Dated.  Copied.  Witnessed.  Incorporated into your medical record. You may also want to appoint someone to manage your financial affairs if you cannot. This is called a durable power of attorney for  finances. It is a separate legal document from the durable power of attorney for health care. You may choose the same person or someone different from your health care proxy to act as your agent in financial matters. LIVING WILL A living will is a set of instructions documenting your wishes about medical care when you cannot care for yourself. It is used if you become:  Terminally ill.  Incapacitated.  Unable to communicate.  Unable to make decisions. Items to consider in your living will include:  The use or non-use of life-sustaining equipment, such as dialysis machines and breathing machines (ventilators).  A do not resuscitate (DNR) order, which is the instruction not to use cardiopulmonary resuscitation (CPR) if breathing or heartbeat stops.  Tube feeding.  Withholding of food and fluids.  Comfort (palliative) care when the goal becomes comfort rather than a cure.  Organ and tissue donation. A living will does not give instructions about distribution of your money and property if you should pass away. It is advisable to seek the expert advice of a lawyer in drawing up a will regarding your possessions. Decisions about taxes, beneficiaries, and asset distribution will be legally binding. This process can relieve your family and friends of any burdens surrounding disputes or questions that may come up about the allocation of your assets. DO NOT RESUSCITATE (DNR) A do not resuscitate (DNR) order is a request to not have CPR in the event that your heart stops beating or you stop breathing. Unless given other instructions, a health care provider will try to help any patient whose  heart has stopped or who has stopped breathing.  This information is not intended to replace advice given to you by your health care provider. Make sure you discuss any questions you have with your health care provider. Document Released: 03/28/2007 Document Revised: 04/12/2015 Document Reviewed:  05/08/2012 Elsevier Interactive Patient Education  2017 [employer].

## 2016-02-09 NOTE — Progress Notes (Signed)
Subjective:     Molly Green is a 41 y.o. female here for a routine exam. [id number] Current complaints: AUB.  Pt reports heavy and irreg menses. Pt has not had this eval prev.  Pt reports that it has been 1 year since that bleeding begin. Pt reports that 'cancer; runs in her family.  Pts mother had liver cancer she had Hep C. Father has DM. Her grandmother had cancer but, she is not sure what type.  Thinks that it may have ben ovarian.    LNMP 2 weeks prev.  Pt is married.  No other sexual partner.   Gynecologic History Patient's last menstrual period was 01/26/2016 (within days). Contraception: tubal ligation Last Pap: 2010. Results were: normal Last mammogram: never had.   The following portions of the patient's history were reviewed and updated as appropriate: allergies, current medications, past family history, past medical history, past social history, past surgical history and problem list.  Review of Systems Pertinent items are noted in HPI.    Objective:   BP (!) 167/104   Pulse 75   Ht 5\' 4"  (1.626 m)   Wt 202 lb (91.6 kg)   LMP 01/26/2016 (Within Days)   BMI 34.67 kg/m  General Appearance:    Alert, cooperative, no distress, appears stated age  Head:    Normocephalic, without obvious abnormality, atraumatic  Eyes:    conjunctiva/corneas clear, EOM's intact, both eyes  Ears:    Normal external ear canals, both ears  Nose:   Nares normal, septum midline, mucosa normal, no drainage    or sinus tenderness  Throat:   Lips, mucosa, and tongue normal; teeth and gums normal  Neck:   Supple, symmetrical, trachea midline, no adenopathy;    thyroid:  no enlargement/tenderness/nodules  Back:     Symmetric, no curvature, ROM normal, no CVA tenderness  Lungs:     Clear to auscultation bilaterally, respirations unlabored  Chest Wall:    No tenderness or deformity   Heart:    Regular rate and rhythm, S1 and S2 normal, no murmur, rub   or gallop  Breast Exam:    No tenderness, masses, or  nipple abnormality  Abdomen:     Soft, non-tender, bowel sounds active all four quadrants,    no masses, no organomegaly  Genitalia:    Normal female without lesion, discharge or tenderness     Extremities:   Extremities normal, atraumatic, no cyanosis or edema  Pulses:   2+ and symmetric all extremities  Skin:   Skin color, texture, turgor normal, no rashes or lesions     Assessment:    Healthy female exam.   AUB GYN with PAP   Plan:    Lab: TSH, CBC;CMP, TSH F/u PAP and cx HCTZ 25 mg po q day Pelvic US F/u in 8 weeks or sooner prn LoEstrin 1/30 1 po q day- no contraindications to OCPs   Iris Tatsch L. Harraway-Smith, M.D., Evern CoreFACOG

## 2016-02-10 LAB — HEMOGLOBIN A1C
Hgb A1c MFr Bld: 5.2 % (ref <5.7)
Mean Plasma Glucose: 103 mg/dL

## 2016-02-11 ENCOUNTER — Ambulatory Visit: Payer: Self-pay

## 2016-02-11 LAB — CYTOLOGY - PAP
Chlamydia: NEGATIVE
DIAGNOSIS: NEGATIVE
HPV: NOT DETECTED
NEISSERIA GONORRHEA: NEGATIVE
Trichomonas: NEGATIVE

## 2016-02-15 ENCOUNTER — Ambulatory Visit (HOSPITAL_COMMUNITY)
Admission: RE | Admit: 2016-02-15 | Discharge: 2016-02-15 | Disposition: A | Discharge status: Home / Self Care | Payer: Medicaid Other | Source: Ambulatory Visit | Attending: Obstetrics & Gynecology | Admitting: Obstetrics & Gynecology

## 2016-02-15 DIAGNOSIS — R102 Pelvic and perineal pain: Secondary | ICD-10-CM | POA: Diagnosis not present

## 2016-02-15 DIAGNOSIS — N938 Other specified abnormal uterine and vaginal bleeding: Secondary | ICD-10-CM

## 2016-02-16 ENCOUNTER — Telehealth: Payer: Self-pay | Admitting: General Practice

## 2016-02-16 NOTE — Telephone Encounter (Signed)
Per Dr Erin FullingHarraway Smith, patient's u/s was normal and she should continue with OCPs and follow up in 2-3 months. Called patient & informed her of results & recommendations. Patient verbalized understanding to all & had no questions

## 2016-02-18 ENCOUNTER — Ambulatory Visit
Admission: RE | Admit: 2016-02-18 | Discharge: 2016-02-18 | Disposition: A | Discharge status: Home / Self Care | Payer: Medicaid Other | Source: Ambulatory Visit | Attending: Obstetrics & Gynecology | Admitting: Obstetrics & Gynecology

## 2016-02-18 DIAGNOSIS — Z1231 Encounter for screening mammogram for malignant neoplasm of breast: Secondary | ICD-10-CM

## 2017-02-11 ENCOUNTER — Emergency Department (HOSPITAL_COMMUNITY)
Admission: EM | Admit: 2017-02-11 | Discharge: 2017-02-11 | Disposition: A | Discharge status: Home / Self Care | Payer: Medicaid Other | Attending: Emergency Medicine | Admitting: Emergency Medicine

## 2017-02-11 ENCOUNTER — Encounter (HOSPITAL_COMMUNITY): Payer: Self-pay | Admitting: *Deleted

## 2017-02-11 ENCOUNTER — Other Ambulatory Visit: Payer: Self-pay

## 2017-02-11 DIAGNOSIS — R05 Cough: Secondary | ICD-10-CM | POA: Diagnosis present

## 2017-02-11 DIAGNOSIS — J069 Acute upper respiratory infection, unspecified: Secondary | ICD-10-CM | POA: Diagnosis not present

## 2017-02-11 DIAGNOSIS — I10 Essential (primary) hypertension: Secondary | ICD-10-CM | POA: Insufficient documentation

## 2017-02-11 DIAGNOSIS — R51 Headache: Secondary | ICD-10-CM | POA: Insufficient documentation

## 2017-02-11 DIAGNOSIS — M7918 Myalgia, other site: Secondary | ICD-10-CM | POA: Insufficient documentation

## 2017-02-11 DIAGNOSIS — R509 Fever, unspecified: Secondary | ICD-10-CM | POA: Diagnosis not present

## 2017-02-11 DIAGNOSIS — R0981 Nasal congestion: Secondary | ICD-10-CM | POA: Insufficient documentation

## 2017-02-11 MED ORDER — PROMETHAZINE-DM 6.25-15 MG/5ML PO SYRP: 5.0000 mL | ORAL_SOLUTION | Freq: Four times a day (QID) | ORAL | 0 refills | Status: AC | PRN

## 2017-02-11 MED ORDER — IBUPROFEN 800 MG PO TABS
800.0000 mg | ORAL_TABLET | Freq: Once | ORAL | Status: AC
  Administered 2017-02-11: 800 mg via ORAL
  Filled 2017-02-11: qty 1

## 2017-02-11 NOTE — ED Triage Notes (Signed)
Pt with flu like symptoms since Thursday, chills, headache, aching in general, no appetite.

## 2017-02-11 NOTE — ED Provider Notes (Signed)
[facility]-EMERGENCY DEPT Provider Note   CSN: [id number] Arrival date & time: 02/11/17  1017     History   Chief Complaint Chief Complaint  Patient presents with  . Influenza    HPI Molly Green is a 42 y.o. female who presents to the emergency department with a chief complaint of myalgias, sinus pain and pressure, headache, xerostomia, subjective fever, and chills that began 4 days ago, constant since onset.  No aggravating or alleviating factors.  She denies chest pain, dyspnea, syncope, sore throat, rhinorrhea, abdominal pain, nausea, vomiting, diarrhea, rash, or neck pain or stiffness.  She has treated her symptoms with TheraFlu and DayQuil since onset with no improvement.  She reports good fluid intake, but she has not had an appetite.  No pertinent past medical history.  No daily medications.  She did not receive a flu shot this year.  She is a non-smoker.  She works at [employer] in [employer].  The history is provided by the patient. No language interpreter was used.    Past Medical History:  Diagnosis Date  . Hypertension   . Obesity     There are no active problems to display for this patient.   History reviewed. No pertinent surgical history.  OB History    No data available       Home Medications    Prior to Admission medications   Medication Sig Start Date End Date Taking? Authorizing Provider  promethazine-dextromethorphan (PROMETHAZINE-DM) 6.25-15 MG/5ML syrup Take 5 mLs by mouth 4 (four) times daily as needed for cough. 02/11/17   Tashonda Pinkus A, PA-C    Family History No family history on file.  Social History Social History   Tobacco Use  . Smoking status: Never Smoker  . Smokeless tobacco: Never Used  Substance Use Topics  . Alcohol use: No  . Drug use: No     Allergies   Patient has no known allergies.   Review of Systems Review of Systems  Constitutional: Positive for chills, fatigue and fever. Negative for  activity change and appetite change.  HENT: Positive for congestion, sinus pressure and sinus pain. Negative for ear discharge, ear pain, mouth sores, postnasal drip, rhinorrhea, sore throat and voice change.   Eyes: Negative for visual disturbance.  Respiratory: Positive for cough. Negative for chest tightness, shortness of breath, wheezing and stridor.   Cardiovascular: Negative for chest pain, palpitations and leg swelling.  Gastrointestinal: Negative for abdominal pain, diarrhea, nausea and vomiting.  Genitourinary: Negative for dysuria, frequency, hematuria and urgency.  Musculoskeletal: Positive for myalgias. Negative for arthralgias, back pain and neck stiffness.  Skin: Negative for rash.  Allergic/Immunologic: Negative for immunocompromised state.  Neurological: Positive for headaches. Negative for syncope, light-headedness and numbness.  Hematological: Negative for adenopathy.  Psychiatric/Behavioral: The patient is not nervous/anxious.   All other systems reviewed and are negative.    Physical Exam Updated Vital Signs BP (!) 165/111 (BP Location: Right Arm)   Pulse 95   Temp 99.9 F (37.7 C) (Oral)   Resp 18   Ht 5\' 4"  (1.626 m)   Wt 83.9 kg (185 lb)   SpO2 100%   BMI 31.76 kg/m   Physical Exam  Constitutional: She appears well-developed and well-nourished. No distress.  Well-appearing female in no acute distress.  HENT:  Head: Normocephalic.  Right Ear: External ear and ear canal normal. A middle ear effusion is present.  Left Ear: External ear and ear canal normal. A middle ear effusion is  present.  Nose: Mucosal edema present. Right sinus exhibits frontal sinus tenderness. Right sinus exhibits no maxillary sinus tenderness. Left sinus exhibits frontal sinus tenderness. Left sinus exhibits no maxillary sinus tenderness.  Mouth/Throat: Uvula is midline and mucous membranes are normal. No trismus in the jaw. No uvula swelling. Posterior oropharyngeal erythema present.  No oropharyngeal exudate or posterior oropharyngeal edema. No tonsillar exudate.  Eyes: Conjunctivae are normal.  Neck: Normal range of motion. Neck supple.  No meningeal signs.  Cardiovascular: Normal rate, regular rhythm, normal heart sounds and intact distal pulses. Exam reveals no gallop and no friction rub.  No murmur heard. Pulmonary/Chest: Effort normal and breath sounds normal. No stridor. No respiratory distress. She has no wheezes. She has no rales. She exhibits no tenderness.  Lungs are clear to auscultation bilaterally.  Abdominal: Soft. She exhibits no distension and no mass. There is no tenderness. There is no rebound and no guarding. No hernia.  Neurological: She is alert.  Skin: Skin is warm. Capillary refill takes less than 2 seconds. No rash noted. She is not diaphoretic.  Psychiatric: Her behavior is normal.  Nursing note and vitals reviewed.    ED Treatments / Results  Labs (all labs ordered are listed, but only abnormal results are displayed) Labs Reviewed - No data to display  EKG  EKG Interpretation None       Radiology No results found.  Procedures Procedures (including critical care time)  Medications Ordered in ED Medications  ibuprofen (ADVIL,MOTRIN) tablet 800 mg (800 mg Oral Given 02/11/17 1322)     Initial Impression / Assessment and Plan / ED Course  I have reviewed the triage vital signs and the nursing notes.  Pertinent labs & imaging results that were available during my care of the patient were reviewed by me and considered in my medical decision making (see chart for details).     42 year old female patient with myalgias, sinus pain and pressure, headache, xerostomia, subjective fever, and chills for 4 days. Symptoms are consistent with URI, likely viral etiology.  I suspect xerostomia is secondary to over-the-counter medications.  No clinical signs of dehydration.  On physical exam, lungs are clear to auscultation bilaterally, she is  in no acute distress. Discussed that antibiotics are not indicated for viral infections.  She declined influenza swab at this time.  Pt will be discharged with symptomatic treatment.  Verbalizes understanding and is agreeable with plan.  Strict return precautions given.  Pt is hemodynamically stable & in NAD prior to dc.  Final Clinical Impressions(s) / ED Diagnoses   Final diagnoses:  URI with cough and congestion    ED Discharge Orders        Ordered    promethazine-dextromethorphan (PROMETHAZINE-DM) 6.25-15 MG/5ML syrup  4 times daily PRN     02/11/17 1342       Ninfa Giannelli A, PA-C 02/11/17 1346    Bethann BerkshireZammit, Joseph, MD 02/15/17 1704

## 2017-02-11 NOTE — ED Notes (Signed)
Bed: WTR8 Expected date:  Expected time:  Means of arrival:  Comments: 

## 2017-02-11 NOTE — Discharge Instructions (Signed)
Take 5 mLs of Promethazine DM every 6 hours as needed for cough and nasal congestion.  Take 600 mg of ibuprofen with food or 1000 mg of Tylenol every 8 hours for body aches, headache, and pain.  Please make sure that you are not taking any other medications that include Tylenol (acetaminophen).  If so, please make sure that you are not taking more than 4000 mg of Tylenol in a 24-hour.   Spray 1 spray of Ocean nasal spray in each nostril morning and night.  This is available over-the-counter.  It is important to stay hydrated so please drink plenty of water, at least 6-8 glasses of 8 oz.   Most viral infections resolve in 7-10 days after symptoms began.  If your symptoms do not improve in the next 3-4 days, please follow-up with your primary care provider.  You may return to work as long as you do not actively have diarrhea or fever, which is defined as a temperature greater than 100.5.  If you develop new or worsening symptoms, including shortness of breath, chest pain, vomiting and diarrhea, or other concerning symptoms, please return to the emergency department for reevaluation.

## 2017-02-14 ENCOUNTER — Emergency Department (HOSPITAL_COMMUNITY)
Admission: EM | Admit: 2017-02-14 | Discharge: 2017-02-14 | Disposition: A | Discharge status: Home / Self Care | Payer: Medicaid Other | Attending: Emergency Medicine | Admitting: Emergency Medicine

## 2017-02-14 ENCOUNTER — Encounter (HOSPITAL_COMMUNITY): Payer: Self-pay | Admitting: Emergency Medicine

## 2017-02-14 ENCOUNTER — Emergency Department (HOSPITAL_COMMUNITY): Payer: Medicaid Other

## 2017-02-14 DIAGNOSIS — B9789 Other viral agents as the cause of diseases classified elsewhere: Secondary | ICD-10-CM | POA: Insufficient documentation

## 2017-02-14 DIAGNOSIS — J069 Acute upper respiratory infection, unspecified: Secondary | ICD-10-CM | POA: Insufficient documentation

## 2017-02-14 DIAGNOSIS — I10 Essential (primary) hypertension: Secondary | ICD-10-CM | POA: Diagnosis not present

## 2017-02-14 DIAGNOSIS — R05 Cough: Secondary | ICD-10-CM | POA: Diagnosis present

## 2017-02-14 MED ORDER — BENZONATATE 100 MG PO CAPS: 100.0000 mg | ORAL_CAPSULE | Freq: Three times a day (TID) | ORAL | 0 refills | Status: AC

## 2017-02-14 MED ORDER — OXYMETAZOLINE HCL 0.05 % NA SOLN
1.0000 | Freq: Once | NASAL | Status: AC
  Administered 2017-02-14: 1 via NASAL
  Filled 2017-02-14: qty 15

## 2017-02-14 MED ORDER — GUAIFENESIN ER 1200 MG PO TB12: 1.0000 | ORAL_TABLET | Freq: Two times a day (BID) | ORAL | 1 refills | Status: AC | PRN

## 2017-02-14 MED ORDER — FLUTICASONE PROPIONATE 50 MCG/ACT NA SUSP: 2.0000 | Freq: Every day | NASAL | 0 refills | Status: AC

## 2017-02-14 NOTE — Discharge Instructions (Signed)
Please read and follow all provided instructions.  Your diagnoses today include:  1. Viral URI with cough     Tests performed today include: Vital signs. See below for your results today.   Medications prescribed/advised:  1. Musinex [Guaifenesin] as a decongestant [thin mucus - you have to be well hydrated when taking this for it to work] 2. Tylenol for fever/pain and Motrin/Ibuprofen for muscle aches 3. Flonase Steroid Nasal Spray. This does not work to maximum capability unless used daily >1-2 weeks.  4. Cough Suppressant: Tessalon  Home care instructions:  An upper respiratory infection (URI) is also sometimes known as the common cold. Most people improve within 1 week, but symptoms can last up to 2 weeks. A residual cough may last even longer.   URI is most commonly caused by a virus. Viruses are NOT treated with antibiotics. You can easily spread the virus to others by oral contact. This includes kissing, sharing a glass, coughing, or sneezing. Touching your mouth or nose and then touching a surface, which is then touched by another person, can also spread the virus.   TREATMENT  Treatment is directed at relieving symptoms. There is no cure. Antibiotics are not effective, because the infection is caused by a virus, not by bacteria. Treatment may include:  Increased fluid intake. Sports drinks offer valuable electrolytes, sugars, and fluids.  Breathing heated mist or steam (vaporizer or shower).  Eating chicken soup or other clear broths, and maintaining good nutrition.  Getting plenty of rest.  Using gargles or lozenges for comfort.  Controlling fevers with ibuprofen or acetaminophen as directed by your caregiver.  Increasing usage of your inhaler if you have asthma.  Return to work when your temperature has returned to normal.   Follow-up instructions: Followup with your primary care doctor in 4 days if your symptoms persist.  Your more than welcome to return to the emergency  department if symptoms worsen or become concerning.  Return instructions:  Please return to the Emergency Department if you do not get better, if you get worse, or new symptoms OR  - Fever (temperature greater than 101.4F)  - Bleeding that does not stop with holding pressure to the area    -Severe pain (please note that you may be more sore the day after your accident)  - Chest Pain  - Difficulty breathing (worsening shortness of breath with sputum production may  be a sign of pneumonia.   - Severe nausea or vomiting  - Inability to tolerate food and liquids  - Passing out  - Skin becoming red around your wounds  - Change in mental status (confusion or lethargy)  - New numbness or weakness     -You develop fever, swollen neck glands, pain with swallowing or white areas on  the back of your throat. This may be a sign of strep throat.  Please return if you have any other emergent concerns.  Additional Information:  Your vital signs today were: BP (!) 135/99 (BP Location: Left Arm)    Pulse 89    Temp 98.4 F (36.9 C) (Oral)    Resp 17    LMP 02/08/2017    SpO2 97%  If your blood pressure (BP) was elevated above 135/85 this visit, please have this repeated by your doctor within one month.

## 2017-02-14 NOTE — ED Notes (Signed)
Patient called for room with no response from lobby.  ?

## 2017-02-14 NOTE — ED Provider Notes (Signed)
[facility]-EMERGENCY DEPT Provider Note   CSN: [id number] Arrival date & time: 02/14/17  1431     History   Chief Complaint Chief Complaint  Patient presents with  . Cough  . Fatigue    HPI Molly Green is a 42 y.o. female with a history of hypertension who presents the emergency department today for cough.  Patient was initially seen here on 02/11/2017 with 4-day history of HA, body aches, sinus pressure/pain, congestion, subjective fever and chills.  She was diagnosed with viral URI and discharged home with promethazine DM syrup.  Patient is presenting today because she has continued symptoms.  She notes today that the headache and subjective fever have now resolved.  She has continued myalgias, sinus pressure/pain, congestion, rhinorrhea.  She is now with a nonproductive cough that mainly occurs when the patient is outside.  She has been taking TheraFlu, DayQuil, Promethazine DM syrup, Advil cold and sinus for her symptoms without relief.  She notes that since starting this medication she is also felt like she has a dry mouth.  She notes she has good oral intake and improved appetite since last visit.  She denies any associated chest pain, shortness of breath, dyspnea on exertion, orthopnea, hemoptysis, ear pain/pressure, sore throat, abdominal pain, nausea/vomiting/diarrhea, rash or lower leg swelling.  No ACE inhibitor use that would be related to onset of cough.  She is a non-smoker.  Did not receive a flu vaccine this year.  Is around several sick contacts as she works in Warden/ranger at [employer].  HPI  Past Medical History:  Diagnosis Date  . Hypertension   . Obesity     There are no active problems to display for this patient.   History reviewed. No pertinent surgical history.  OB History    No data available       Home Medications    Prior to Admission medications   Medication Sig Start Date End Date Taking? Authorizing Provider    promethazine-dextromethorphan (PROMETHAZINE-DM) 6.25-15 MG/5ML syrup Take 5 mLs by mouth 4 (four) times daily as needed for cough. 02/11/17   McDonald, Mia A, PA-C    Family History No family history on file.  Social History Social History   Tobacco Use  . Smoking status: Never Smoker  . Smokeless tobacco: Never Used  Substance Use Topics  . Alcohol use: No  . Drug use: No     Allergies   Patient has no known allergies.   Review of Systems Review of Systems  All other systems reviewed and are negative.    Physical Exam Updated Vital Signs BP (!) 135/99 (BP Location: Left Arm)   Pulse 89   Temp 98.4 F (36.9 C) (Oral)   Resp 17   LMP 02/08/2017   SpO2 97%   Physical Exam  Constitutional: She appears well-developed and well-nourished.  HENT:  Head: Normocephalic and atraumatic.  Right Ear: Tympanic membrane and external ear normal.  Left Ear: Tympanic membrane and external ear normal.  Nose: Mucosal edema and rhinorrhea present. Right sinus exhibits maxillary sinus tenderness. Right sinus exhibits no frontal sinus tenderness. Left sinus exhibits maxillary sinus tenderness. Left sinus exhibits no frontal sinus tenderness.  Mouth/Throat: Uvula is midline, oropharynx is clear and moist and mucous membranes are normal. No tonsillar exudate.  The patient has normal phonation and is in control of secretions. No stridor.  Midline uvula without edema. Soft palate rises symmetrically.  No tonsillar erythema or exudates. No PTA. Tongue protrusion is  normal. No trismus. No creptius on neck palpation and patient has good dentition. No gingival erythema or fluctuance noted. Mucus membranes moist.   Eyes: Pupils are equal, round, and reactive to light. Right eye exhibits no discharge. Left eye exhibits no discharge. No scleral icterus.  Neck: Trachea normal. Neck supple. No spinous process tenderness present. No neck rigidity. Normal range of motion present.  Cardiovascular: Normal  rate, regular rhythm and intact distal pulses.  No murmur heard. Pulses:      Radial pulses are 2+ on the right side, and 2+ on the left side.       Dorsalis pedis pulses are 2+ on the right side, and 2+ on the left side.       Posterior tibial pulses are 2+ on the right side, and 2+ on the left side.  No lower extremity swelling or edema. Calves symmetric in size bilaterally.  Pulmonary/Chest: Effort normal and breath sounds normal. No accessory muscle usage. No tachypnea. No respiratory distress. She exhibits no tenderness.  No increased work of breathing. No accessory muscle use. Patient is sitting upright, speaking in full sentences without difficulty. Lungs CTA b/l.   Abdominal: Soft. Bowel sounds are normal. There is no tenderness. There is no rebound and no guarding.  Musculoskeletal: She exhibits no edema.  Lymphadenopathy:    She has no cervical adenopathy.  Neurological: She is alert.  Skin: Skin is warm and dry. No petechiae, no purpura and no rash noted. She is not diaphoretic.  Psychiatric: She has a normal mood and affect.  Nursing note and vitals reviewed.    ED Treatments / Results  Labs (all labs ordered are listed, but only abnormal results are displayed) Labs Reviewed - No data to display  EKG  EKG Interpretation None       Radiology Dg Chest 2 View  Result Date: 02/14/2017 CLINICAL DATA:  42 year old female with history of worsening cough and body aches for the past 6 days. History of hypertension. EXAM: CHEST  2 VIEW COMPARISON:  No priors. FINDINGS: Lung volumes are normal. No consolidative airspace disease. No pleural effusions. No pneumothorax. No evidence of pulmonary edema. Heart size is borderline enlarged. Upper mediastinal contours are within normal limits. Sclerotic lesion associated with the right scapula measuring 11 mm has a narrow zone of transition and well-defined margins, likely to represent a bone island. IMPRESSION: 1. No radiographic evidence  of acute cardiopulmonary disease. Electronically Signed   By: Trudie Reed M.D.   On: 02/14/2017 17:54    Procedures Procedures (including critical care time)  Medications Ordered in ED Medications  oxymetazoline (AFRIN) 0.05 % nasal spray 1 spray (not administered)     Initial Impression / Assessment and Plan / ED Course  I have reviewed the triage vital signs and the nursing notes.  Pertinent labs & imaging results that were available during my care of the patient were reviewed by me and considered in my medical decision making (see chart for details).     42 y.o. female presenting with objective fever, chills, body aches, congestion, sinus pressure/pain, nonproductive cough. Pt CXR negative for acute infiltrate. Patients symptoms are consistent with URI, likely viral etiology.  Xerostomia sensation is likely consistent with medication side effects.  She does not appear dehydrated.  Discussed that antibiotics are not indicated for viral infections. Pt will be discharged with symptomatic treatment.  The patient discontinue home medications.  Will prescribe Mucinex, Flonase, Tessalon.  Advised tylenol and ibuprofen for fever and body aches.  Verbalizes understanding and is agreeable with plan.  Return precautions discussed.  Pt is hemodynamically stable & in NAD prior to dc.  Final Clinical Impressions(s) / ED Diagnoses   Final diagnoses:  Viral URI with cough    ED Discharge Orders        Ordered    benzonatate (TESSALON) 100 MG capsule  Every 8 hours     02/14/17 1829    Guaifenesin (MUCINEX MAXIMUM STRENGTH) 1200 MG TB12  Every 12 hours PRN     02/14/17 1829    fluticasone (FLONASE) 50 MCG/ACT nasal spray  Daily     02/14/17 1829       Princella PellegriniMaczis, Joesphine Schemm M, PA-C 02/14/17 1829    Lorre NickAllen, Anthony, MD 02/14/17 2352

## 2017-02-14 NOTE — ED Triage Notes (Signed)
Patient reports that she was seen here couple days ago for flu like symptoms and given medications for cough but still not getting any better and fatigue and dry mouth.

## 2017-10-28 IMAGING — CR DG FOOT COMPLETE 3+V*R*
3 series · 3 of 3 positions shown · non-contrast
Comparison: None in PACs

CLINICAL DATA: Patient dropped a can of hot belted cheese on the
right foot at work last night with blistering over the foot and pain
over the dorsum of the foot.

EXAM:
RIGHT FOOT COMPLETE - 3+ VIEW

[x foot ap right]
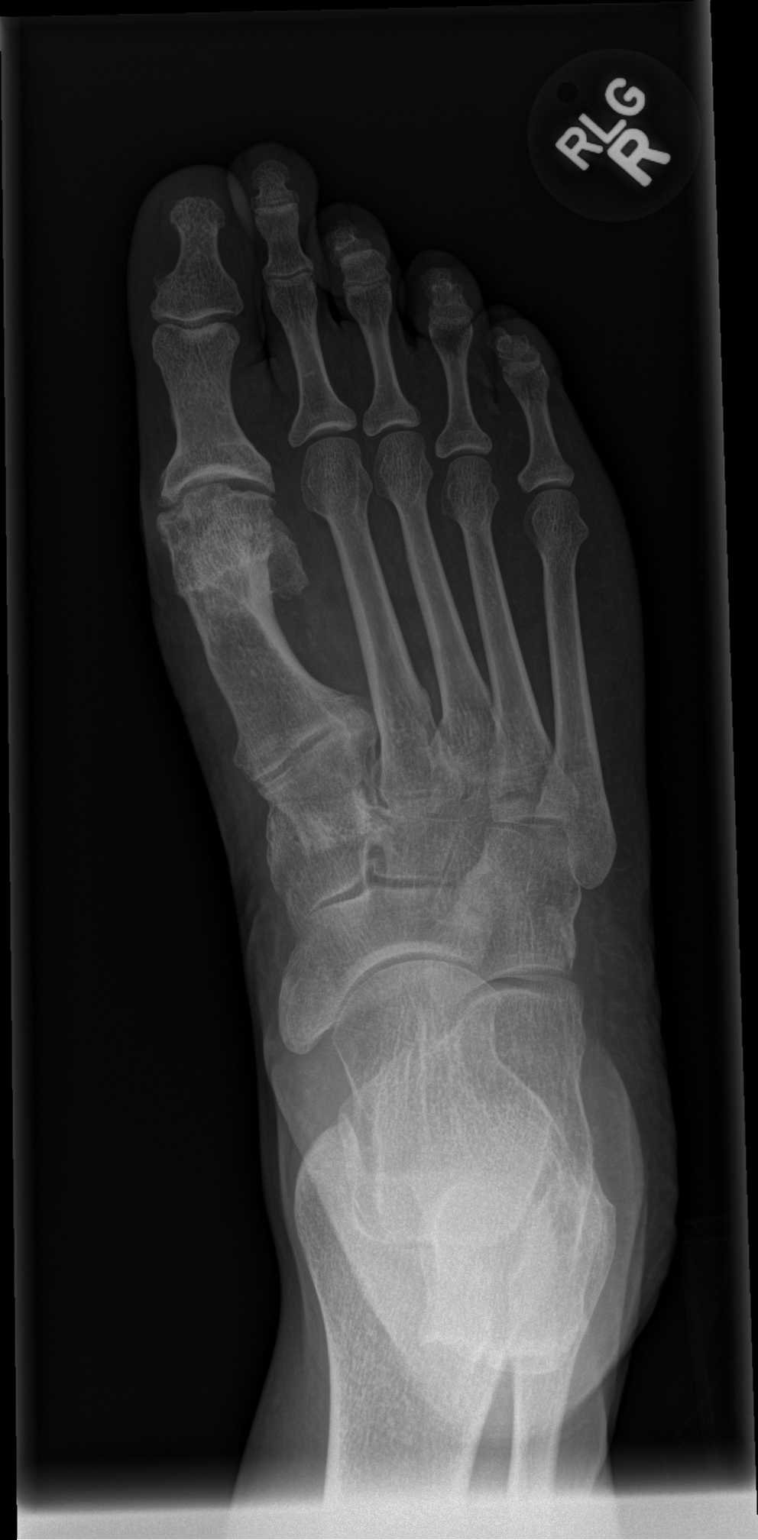

[x foot obl right]
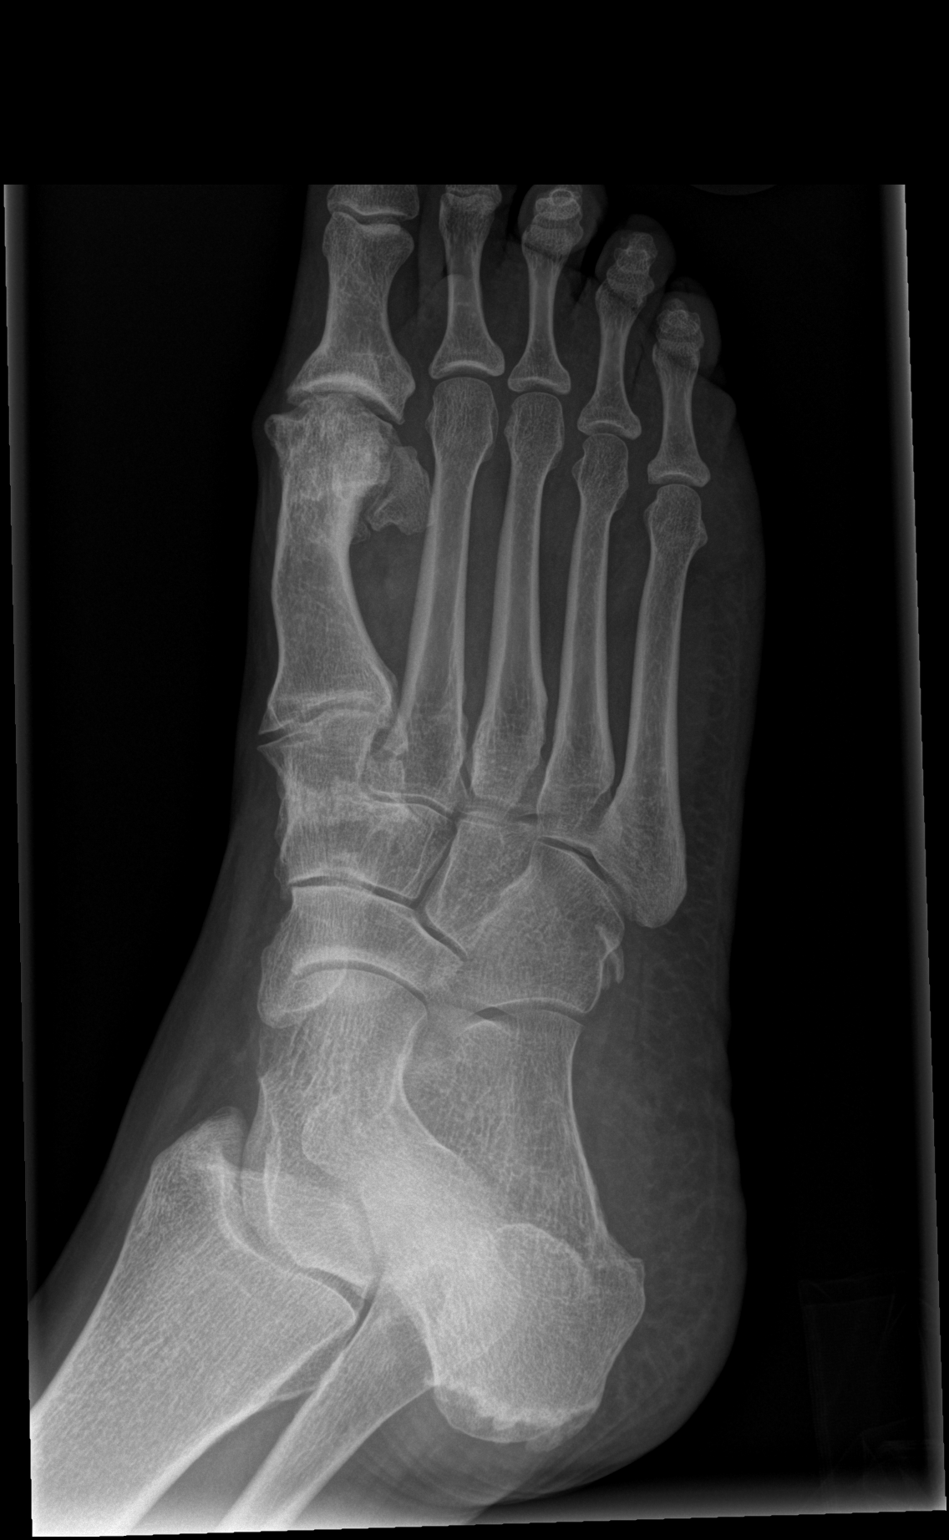

[x foot lat right]
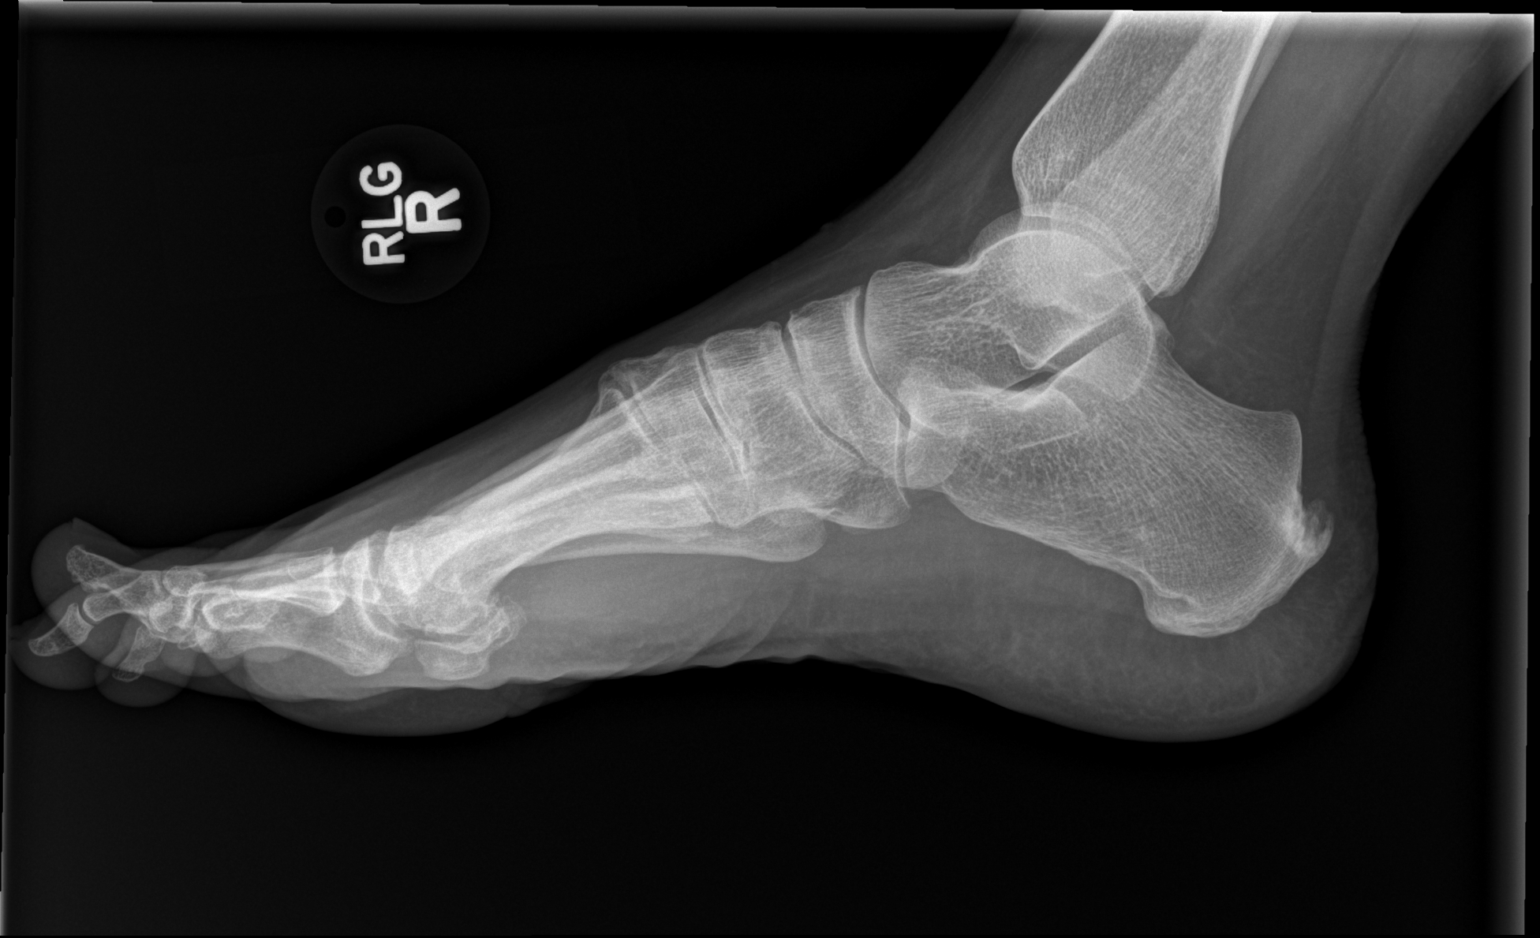

[3 of 3 positions shown; findings below may reference images not displayed]

FINDINGS: The bones are subjectively adequately mineralized. The phalanges are
intact. There are degenerative changes of the first MTP joint. There
is a mild hallux valgus contour of the first ray which may have been
previously surgically treated. The second through fifth MTP joints
are unremarkable. The IP joints exhibit no acute abnormalities.
There is a double density that projects in the region of the first
and second cuneiforms which may be degenerative change or could
reflect a fracture. The third through fifth metatarsal bases appear
normal. There is an Achilles region calcaneal spur.
IMPRESSION: 1. Abnormal appearance of the first and second cuneiforms which may
reflect acute fracture or dislocation. Degenerative or old post
traumatic changes could produce similar findings.
2. Moderate degenerative change of the first MTP joint. Possible
previous hallux valgus repair versus old trauma involving the distal
aspect of the first metatarsal.
3. If the object dropped upon the foot was heavy and a fracture felt
clinically possible, CT scanning of the right foot with attention to
the first tarsometatarsal joint and the first metatarsophalangeal
joint would be useful.

## 2018-02-09 IMAGING — US US TRANSVAGINAL NON-OB
1 series · 15 of 25 positions shown · non-contrast
Comparison: None

CLINICAL DATA: Dysfunctional uterine bleeding, pelvic pain for 1
year

EXAM:
TRANSABDOMINAL AND TRANSVAGINAL ULTRASOUND OF PELVIS
TECHNIQUE: Both transabdominal and transvaginal ultrasound examinations of the
pelvis were performed. Transabdominal technique was performed for
global imaging of the pelvis including uterus, ovaries, adnexal
regions, and pelvic cul-de-sac. It was necessary to proceed with
endovaginal exam following the transabdominal exam to visualize the
uterus, endometrium, ovaries and adnexa .

[Series 1: us transvaginal non-ob · 15 of 66 slices shown]
[im 1/66]
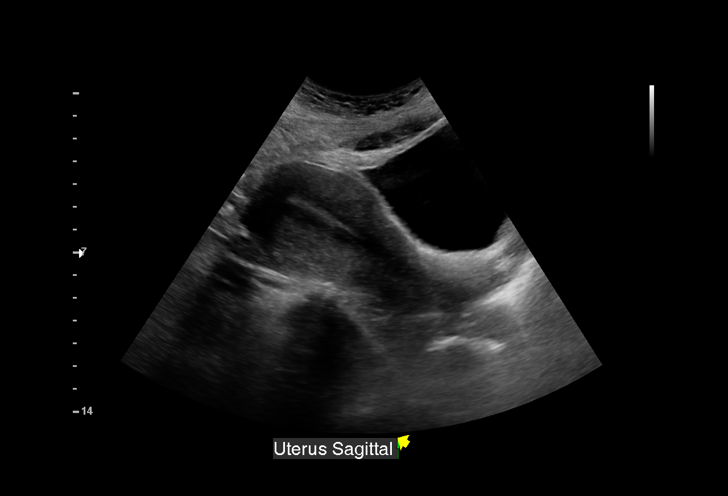
[im 6/66]
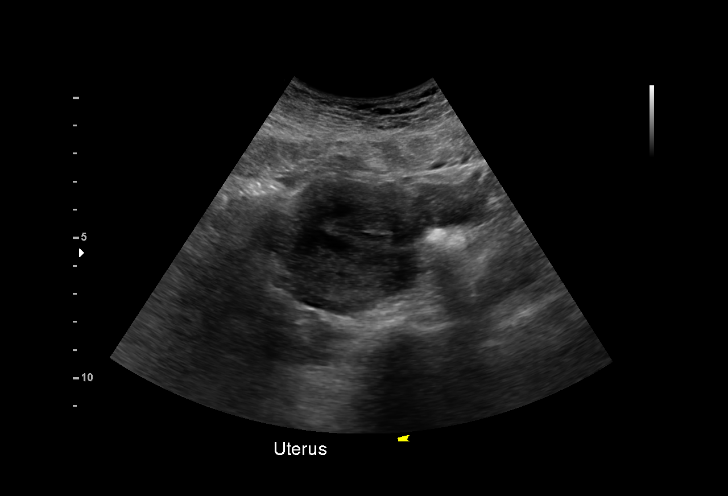
[im 11/66]
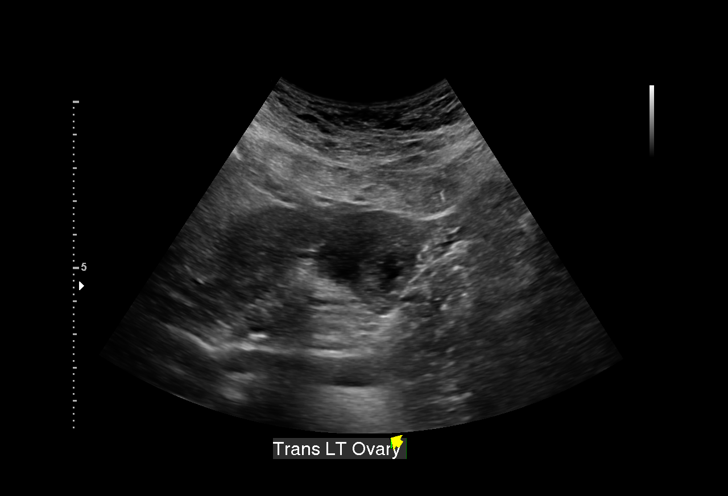
[im 14/66]
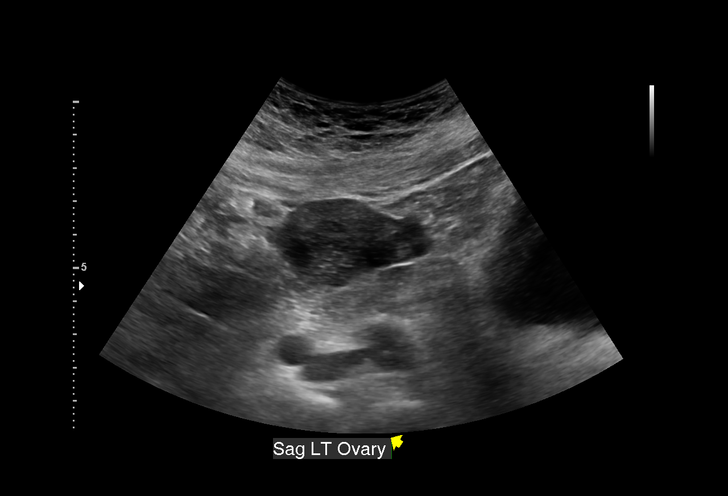
[im 19/66]
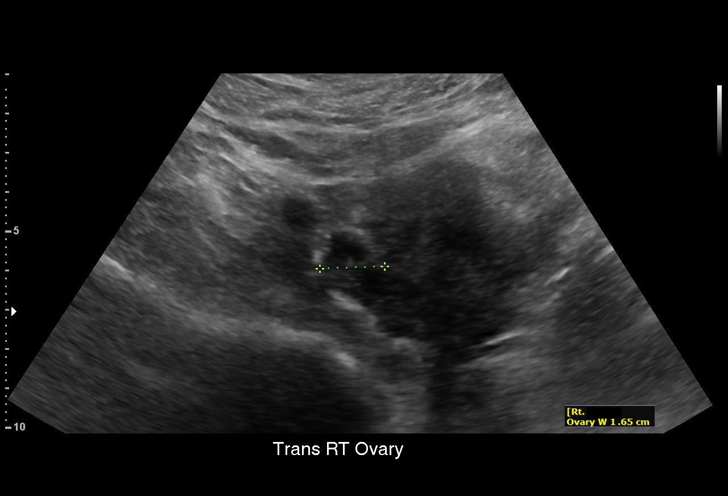
[im 25/66]
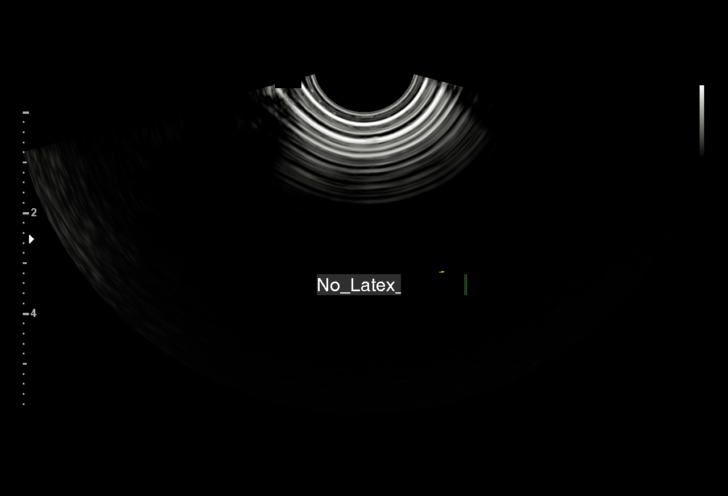
[im 28/66]
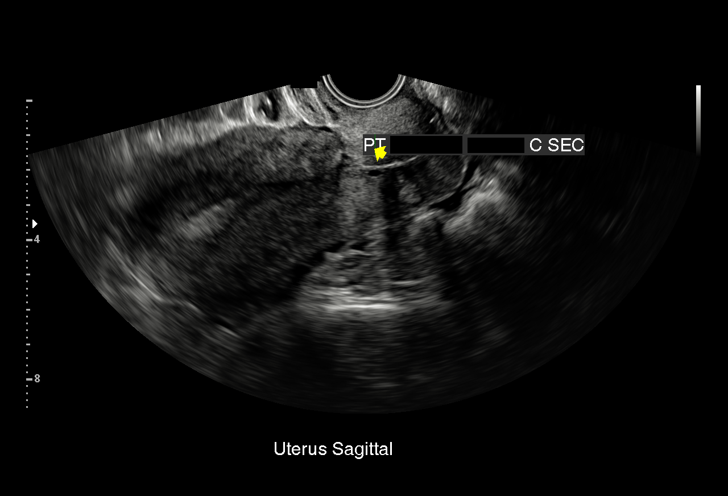
[im 33/66]
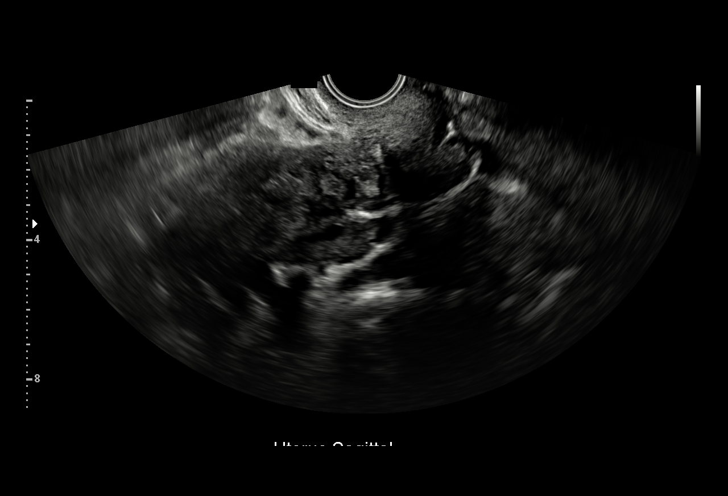
[im 38/66]
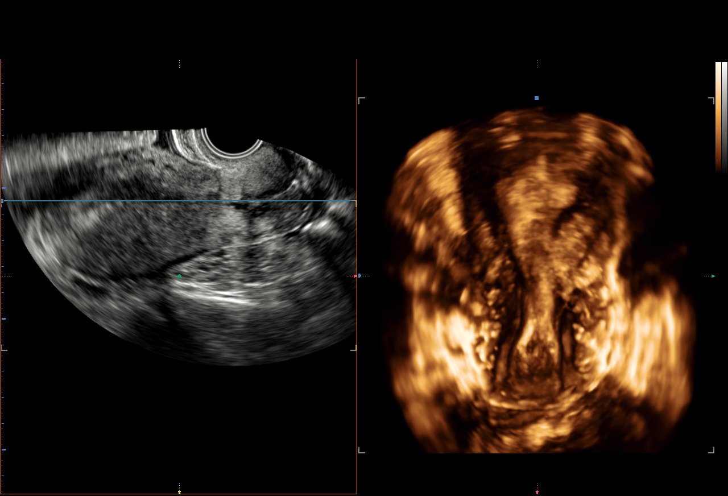
[im 41/66]
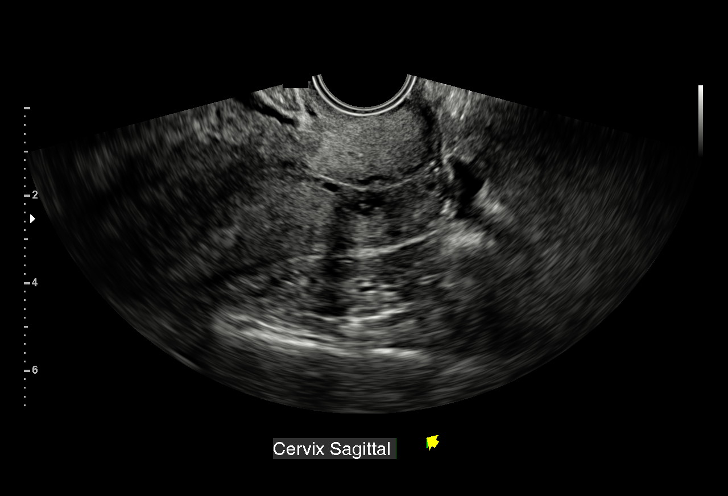
[im 47/66]
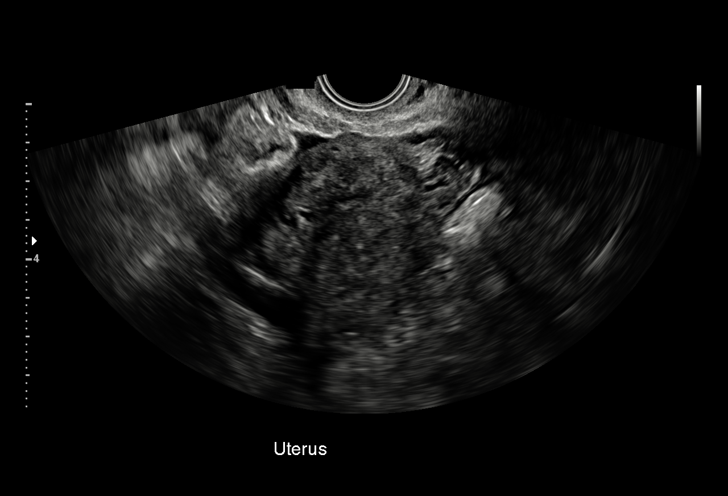
[im 52/66]
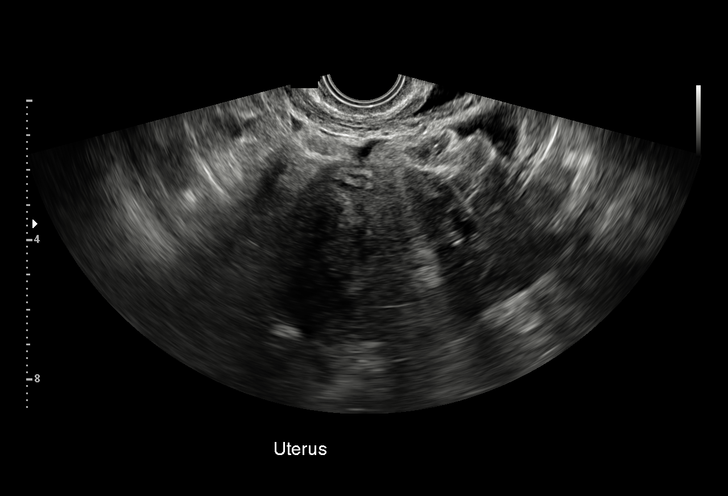
[im 55/66]
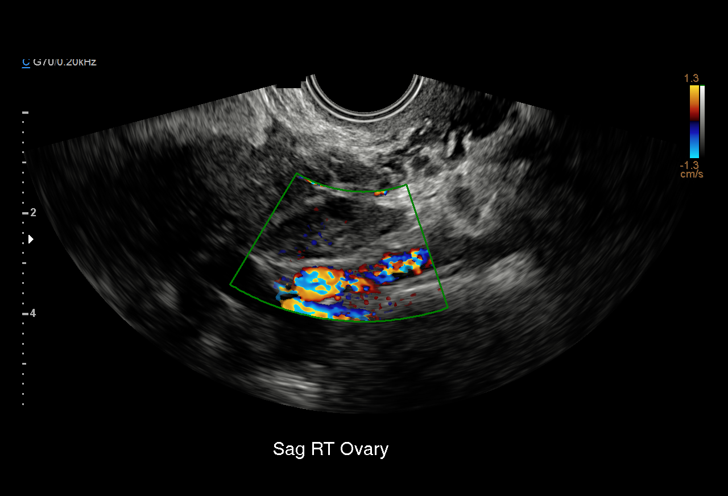
[im 60/66]
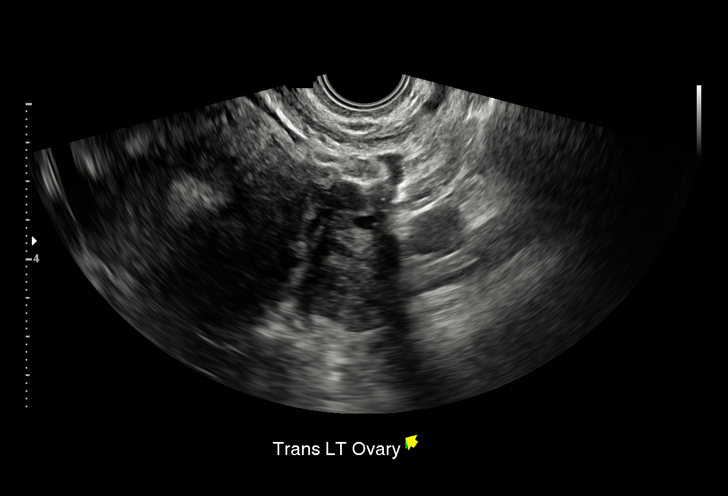
[im 66/66]
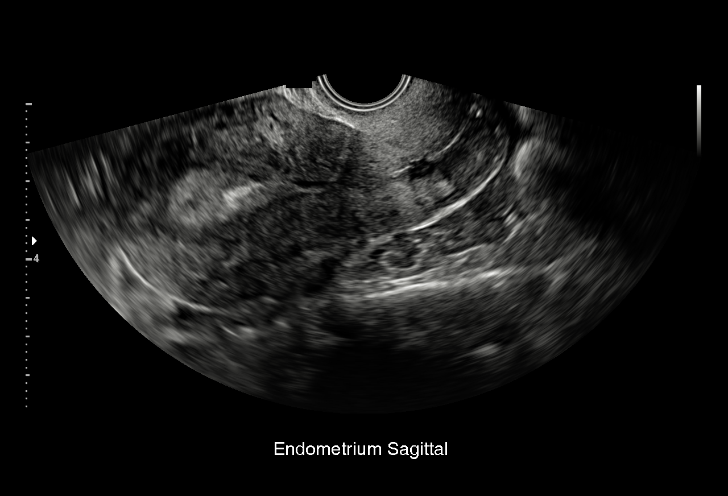

[15 of 25 positions shown; findings below may reference images not displayed]

FINDINGS: Uterus

Measurements: 10.0 x 5.0 x 6.3 cm. No fibroids or other mass
visualized.

Endometrium

Thickness: Seen mm in thickness.  No focal abnormality visualized.

Right ovary

Measurements: 2.9 x 1.2 x 1.1 cm. Normal appearance/no adnexal mass.

Left ovary

Measurements: 0.9 x 2.7 x 3.0 cm. Normal appearance/no adnexal mass.

Other findings

Trace free fluid in the pelvis
IMPRESSION: Unremarkable pelvic ultrasound.

## 2019-02-09 IMAGING — CR DG CHEST 2V
2 series · 2 of 2 positions shown · non-contrast
Comparison: No priors.

CLINICAL DATA: 41-year-old female with history of worsening cough
and body aches for the past 6 days. History of hypertension.

EXAM:
CHEST  2 VIEW

[w chest pa]
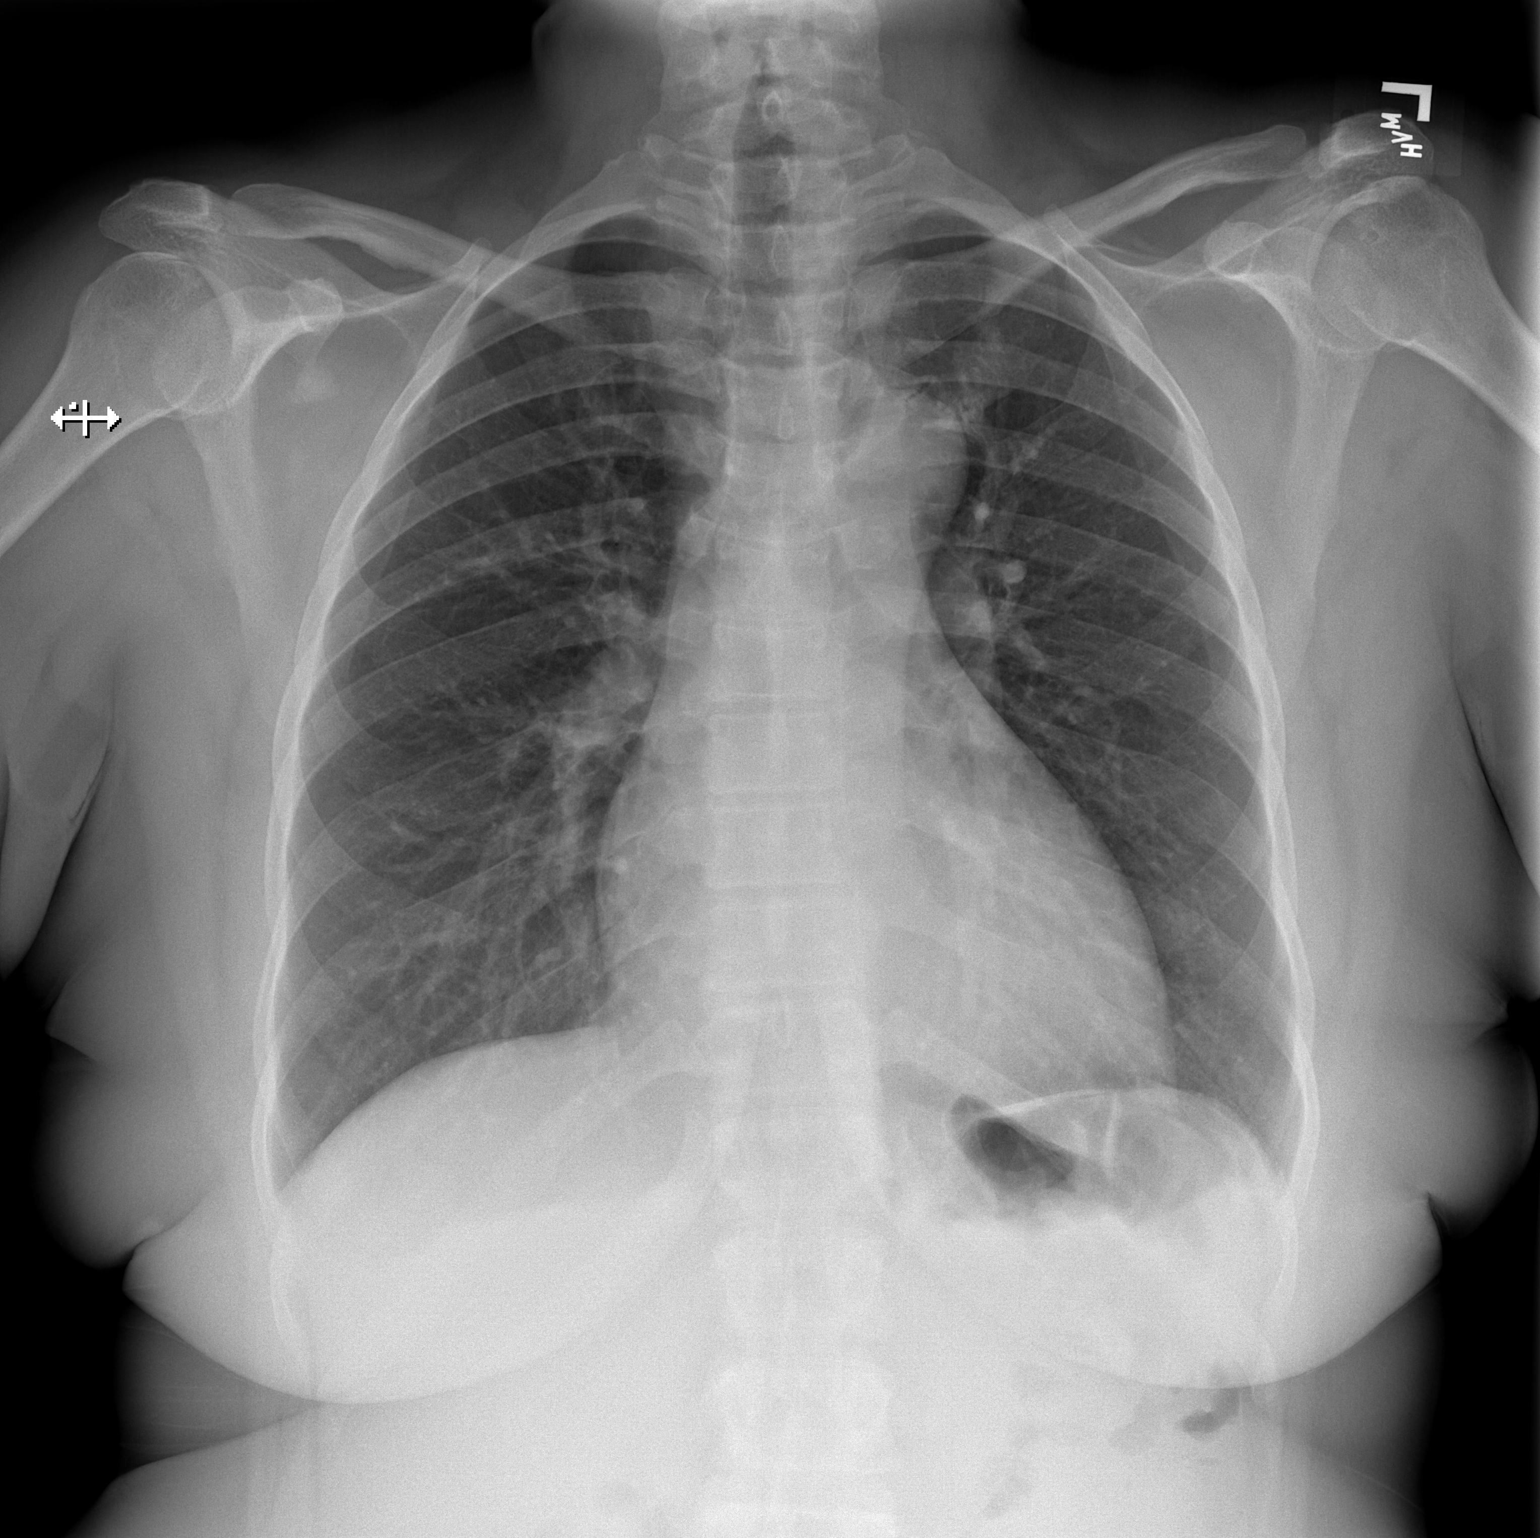

[w chest lat]
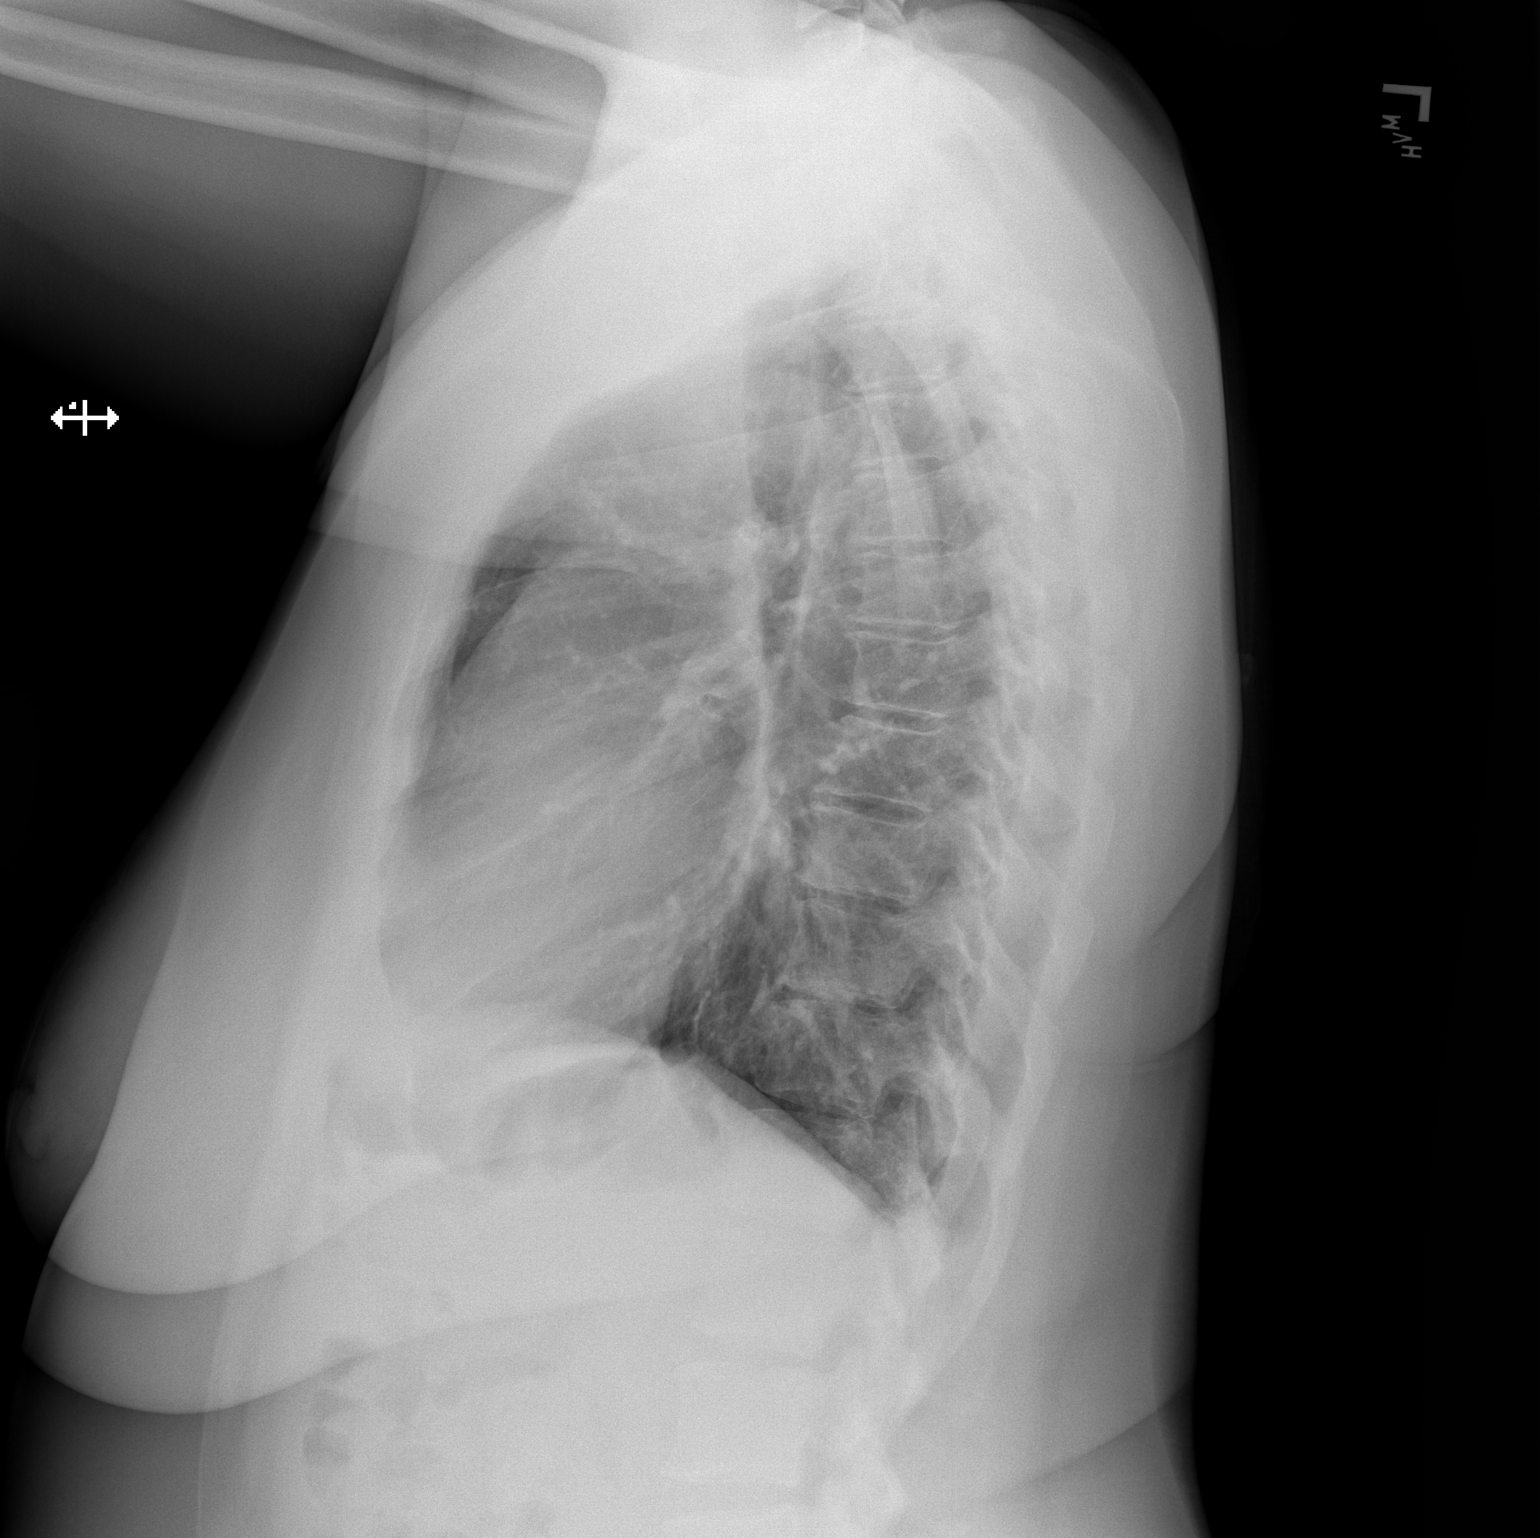

[2 of 2 positions shown; findings below may reference images not displayed]

FINDINGS: Lung volumes are normal. No consolidative airspace disease. No
pleural effusions. No pneumothorax. No evidence of pulmonary edema.
Heart size is borderline enlarged. Upper mediastinal contours are
within normal limits. Sclerotic lesion associated with the right
scapula measuring 11 mm has a narrow zone of transition and
well-defined margins, likely to represent a bone island.
IMPRESSION: 1. No radiographic evidence of acute cardiopulmonary disease.
